# Patient Record
Sex: Male | Born: 1975 | Race: White | Hispanic: No | Marital: Married | Smoking: Current every day smoker
Health system: Southern US, Community
[De-identification: ages and names within clinical notes are randomized; demographics above are authoritative.]

## PROBLEM LIST (undated history)

## (undated) DIAGNOSIS — M545 Low back pain, unspecified: Secondary | ICD-10-CM

## (undated) DIAGNOSIS — F419 Anxiety disorder, unspecified: Secondary | ICD-10-CM

## (undated) DIAGNOSIS — I10 Essential (primary) hypertension: Secondary | ICD-10-CM

## (undated) DIAGNOSIS — E669 Obesity, unspecified: Secondary | ICD-10-CM

## (undated) HISTORY — DX: Low back pain: M54.5

## (undated) HISTORY — DX: Anxiety disorder, unspecified: F41.9

## (undated) HISTORY — DX: Obesity, unspecified: E66.9

## (undated) HISTORY — DX: Low back pain, unspecified: M54.50

---

## 1998-04-24 ENCOUNTER — Emergency Department (HOSPITAL_COMMUNITY): Admission: EM | Admit: 1998-04-24 | Discharge: 1998-04-24 | Payer: Self-pay | Admitting: Emergency Medicine

## 1998-04-25 ENCOUNTER — Emergency Department (HOSPITAL_COMMUNITY): Admission: EM | Admit: 1998-04-25 | Discharge: 1998-04-25 | Payer: Self-pay | Admitting: Internal Medicine

## 1999-01-20 ENCOUNTER — Emergency Department (HOSPITAL_COMMUNITY): Admission: EM | Admit: 1999-01-20 | Discharge: 1999-01-20 | Payer: Self-pay | Admitting: Emergency Medicine

## 1999-01-20 ENCOUNTER — Encounter: Payer: Self-pay | Admitting: Emergency Medicine

## 1999-07-10 ENCOUNTER — Encounter: Payer: Self-pay | Admitting: Emergency Medicine

## 1999-07-10 ENCOUNTER — Emergency Department (HOSPITAL_COMMUNITY): Admission: EM | Admit: 1999-07-10 | Discharge: 1999-07-10 | Payer: Self-pay | Admitting: Emergency Medicine

## 1999-10-22 ENCOUNTER — Emergency Department (HOSPITAL_COMMUNITY): Admission: EM | Admit: 1999-10-22 | Discharge: 1999-10-23 | Payer: Self-pay | Admitting: Emergency Medicine

## 1999-10-23 ENCOUNTER — Encounter: Payer: Self-pay | Admitting: Emergency Medicine

## 2000-11-05 ENCOUNTER — Emergency Department (HOSPITAL_COMMUNITY): Admission: EM | Admit: 2000-11-05 | Discharge: 2000-11-05 | Payer: Self-pay | Admitting: *Deleted

## 2004-03-11 ENCOUNTER — Emergency Department (HOSPITAL_COMMUNITY): Admission: EM | Admit: 2004-03-11 | Discharge: 2004-03-11 | Payer: Self-pay | Admitting: *Deleted

## 2004-05-26 ENCOUNTER — Emergency Department (HOSPITAL_COMMUNITY): Admission: EM | Admit: 2004-05-26 | Discharge: 2004-05-26 | Payer: Self-pay | Admitting: Emergency Medicine

## 2007-03-22 ENCOUNTER — Emergency Department (HOSPITAL_COMMUNITY): Admission: EM | Admit: 2007-03-22 | Discharge: 2007-03-22 | Payer: Self-pay | Admitting: Emergency Medicine

## 2008-07-30 ENCOUNTER — Emergency Department: Payer: Self-pay | Admitting: Unknown Physician Specialty

## 2008-10-07 ENCOUNTER — Emergency Department: Payer: Self-pay

## 2008-12-20 ENCOUNTER — Emergency Department (HOSPITAL_COMMUNITY): Admission: EM | Admit: 2008-12-20 | Discharge: 2008-12-20 | Payer: Self-pay | Admitting: Emergency Medicine

## 2010-01-26 ENCOUNTER — Emergency Department (HOSPITAL_COMMUNITY): Admission: EM | Admit: 2010-01-26 | Discharge: 2010-01-26 | Payer: Self-pay | Admitting: Emergency Medicine

## 2011-09-09 ENCOUNTER — Emergency Department (HOSPITAL_COMMUNITY)
Admission: EM | Admit: 2011-09-09 | Discharge: 2011-09-09 | Disposition: A | Discharge status: Home / Self Care | Payer: Self-pay | Attending: Emergency Medicine | Admitting: Emergency Medicine

## 2011-09-09 DIAGNOSIS — T391X1A Poisoning by 4-Aminophenol derivatives, accidental (unintentional), initial encounter: Secondary | ICD-10-CM | POA: Insufficient documentation

## 2011-09-09 LAB — SALICYLATE LEVEL: Salicylate Lvl: <2 mg/dL — ABNORMAL LOW (ref 2.8–20.0)

## 2011-09-09 LAB — HEPATIC FUNCTION PANEL
AST: 35 U/L (ref 0–37)
Albumin: 3.9 g/dL (ref 3.5–5.2)
Bilirubin, Direct: <0.1 mg/dL (ref 0.0–0.3)
Total Protein: 7.2 g/dL (ref 6.0–8.3)

## 2011-09-09 LAB — CBC
HCT: 47.3 % (ref 39.0–52.0)
Hemoglobin: 16.6 g/dL (ref 13.0–17.0)
RBC: 5.83 MIL/uL — ABNORMAL HIGH (ref 4.22–5.81)
WBC: 11.8 10*3/uL — ABNORMAL HIGH (ref 4.0–10.5)

## 2011-09-09 LAB — PROTIME-INR: INR: 0.94 (ref 0.00–1.49)

## 2011-09-09 LAB — RAPID URINE DRUG SCREEN, HOSP PERFORMED
Amphetamines: NOT DETECTED
Benzodiazepines: NOT DETECTED
Opiates: NOT DETECTED

## 2011-09-09 LAB — POCT I-STAT, CHEM 8
Chloride: 108 mEq/L (ref 96–112)
HCT: 52 % (ref 39.0–52.0)
Hemoglobin: 17.7 g/dL — ABNORMAL HIGH (ref 13.0–17.0)
Potassium: 3.9 mEq/L (ref 3.5–5.1)
Sodium: 140 mEq/L (ref 135–145)

## 2011-09-09 LAB — DIFFERENTIAL
Basophils Absolute: 0.1 10*3/uL (ref 0.0–0.1)
Lymphocytes Relative: 24 % (ref 12–46)
Neutro Abs: 7.8 10*3/uL — ABNORMAL HIGH (ref 1.7–7.7)

## 2011-09-09 LAB — ACETAMINOPHEN LEVEL: Acetaminophen (Tylenol), Serum: <15 ug/mL (ref 10–30)

## 2012-04-22 ENCOUNTER — Ambulatory Visit (HOSPITAL_COMMUNITY)
Admission: RE | Admit: 2012-04-22 | Discharge: 2012-04-22 | Disposition: A | Discharge status: Home / Self Care | Payer: PRIVATE HEALTH INSURANCE | Attending: Psychiatry | Admitting: Psychiatry

## 2012-04-22 ENCOUNTER — Telehealth (HOSPITAL_COMMUNITY): Payer: Self-pay | Admitting: Licensed Clinical Social Worker

## 2012-04-22 ENCOUNTER — Encounter (HOSPITAL_COMMUNITY): Payer: Self-pay | Admitting: Licensed Clinical Social Worker

## 2012-04-22 DIAGNOSIS — F1010 Alcohol abuse, uncomplicated: Secondary | ICD-10-CM | POA: Insufficient documentation

## 2012-04-22 DIAGNOSIS — F39 Unspecified mood [affective] disorder: Secondary | ICD-10-CM | POA: Insufficient documentation

## 2012-04-22 DIAGNOSIS — F411 Generalized anxiety disorder: Secondary | ICD-10-CM | POA: Insufficient documentation
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
## 2012-05-29 ENCOUNTER — Emergency Department (HOSPITAL_BASED_OUTPATIENT_CLINIC_OR_DEPARTMENT_OTHER)
Admission: EM | Admit: 2012-05-29 | Discharge: 2012-05-29 | Disposition: A | Discharge status: Home / Self Care | Payer: PRIVATE HEALTH INSURANCE | Attending: Emergency Medicine | Admitting: Emergency Medicine

## 2012-05-29 ENCOUNTER — Encounter (HOSPITAL_BASED_OUTPATIENT_CLINIC_OR_DEPARTMENT_OTHER): Payer: Self-pay | Admitting: *Deleted

## 2012-05-29 DIAGNOSIS — M549 Dorsalgia, unspecified: Secondary | ICD-10-CM

## 2012-05-29 DIAGNOSIS — Y92009 Unspecified place in unspecified non-institutional (private) residence as the place of occurrence of the external cause: Secondary | ICD-10-CM | POA: Insufficient documentation

## 2012-05-29 DIAGNOSIS — IMO0002 Reserved for concepts with insufficient information to code with codable children: Secondary | ICD-10-CM | POA: Insufficient documentation

## 2012-05-29 DIAGNOSIS — X500XXA Overexertion from strenuous movement or load, initial encounter: Secondary | ICD-10-CM | POA: Insufficient documentation

## 2012-05-29 DIAGNOSIS — Y9389 Activity, other specified: Secondary | ICD-10-CM | POA: Insufficient documentation

## 2012-05-29 DIAGNOSIS — Y998 Other external cause status: Secondary | ICD-10-CM | POA: Insufficient documentation

## 2012-05-29 DIAGNOSIS — I10 Essential (primary) hypertension: Secondary | ICD-10-CM | POA: Insufficient documentation

## 2012-05-29 DIAGNOSIS — T148XXA Other injury of unspecified body region, initial encounter: Secondary | ICD-10-CM

## 2012-05-29 HISTORY — DX: Essential (primary) hypertension: I10

## 2012-05-29 MED ORDER — CYCLOBENZAPRINE HCL 5 MG PO TABS: 5.0000 mg | ORAL_TABLET | Freq: Two times a day (BID) | ORAL | Status: AC | PRN

## 2012-05-29 MED ORDER — IBUPROFEN 600 MG PO TABS: 600.0000 mg | ORAL_TABLET | Freq: Four times a day (QID) | ORAL | Status: AC | PRN

## 2012-05-29 MED ORDER — LISINOPRIL 20 MG PO TABS: 10.0000 mg | ORAL_TABLET | Freq: Every day | ORAL | Status: DC

## 2012-05-29 NOTE — ED Provider Notes (Signed)
History   This chart was scribed for Forbes Cellar, MD by Shari Heritage. The patient was seen in room MH07/MH07. Patient's care was started at 1643.     CSN: [id number]  Arrival date & time 05/29/12  1643   First MD Initiated Contact with Patient 05/29/12 1726      Chief Complaint  Patient presents with  . Back Pain    (Consider location/radiation/quality/duration/timing/severity/associated sxs/prior treatment) The history is provided by the patient and the spouse. No language interpreter was used.   Jeffery Hendrix is a 36 y.o. Male  who presents to the Emergency Department complaining of persistent, moderate, bilateral lower back pain onset 6 weeks ago. Patient says that his pain is 5/10. Patient says that he suffered an injury to his back approximately 6 weeks ago while moving a chair at home. Patient denies prior lower back pain. Patient denies urinary symptoms including frequency, urgency, dysuria or hematuria. Patient also denies nausea, vomiting, diarrhea, fever, chills. Patient denies substance abuse or steroid use. Patient says that he decided to come in to ED because he needs medical clearance for work. Patient states that he lost his original medical notation from the Urgent Care center. 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 Patient with h/o anxiety and HTN. Patient takes Lisinopril to control his BP, but he needs a refill.     York Cerise, RN 05/29/2012 16:56     Pt states he injured his back about 6 wks ago and was seen at Urgent Care and treated. Got somewhat better, but reinjured and UC was closed. Not allowed to go back to work until cleared. Needs refill for BP med as well.    Past Medical History  Diagnosis Date  . Anxiety   . Hypertension     History reviewed. No pertinent past surgical history.  History reviewed. No pertinent family history.  History  Substance Use Topics  . Smoking status: Not on file  .  Smokeless tobacco: Not on file  . Alcohol Use: 0.0 oz/week    56-64 Cans of beer per week    Review of Systems A complete 10 system review of systems was obtained and all systems are negative except as noted in the HPI and PMH.   Allergies  Review of patient's allergies indicates no known allergies.  Home Medications   Current Outpatient Rx  Name Route Sig Dispense Refill  . CYCLOBENZAPRINE HCL 5 MG PO TABS Oral Take 1 tablet (5 mg total) by mouth 2 (two) times daily as needed for muscle spasms. 10 tablet 0  . IBUPROFEN 600 MG PO TABS Oral Take 1 tablet (600 mg total) by mouth every 6 (six) hours as needed for pain. 30 tablet 0  . LISINOPRIL 20 MG PO TABS Oral Take 0.5 tablets (10 mg total) by mouth daily. 10 tablet 0    BP 162/88  Pulse 92  Temp 98.6 F (37 C) (Oral)  Resp 18  Ht 5\' 7"  (1.702 m)  Wt 260 lb (117.935 kg)  BMI 40.72 kg/m2  SpO2 100%  Physical Exam  Constitutional: He is oriented to person, place, and time. He appears well-developed and well-nourished. No distress.  HENT:  Head: Atraumatic.  Eyes: Conjunctivae are normal.  Neck: Neck supple.  Cardiovascular: Normal rate and regular rhythm.   Pulmonary/Chest: Effort normal and breath sounds normal. He has no wheezes. He has no rales.  Abdominal: Soft. Bowel sounds are normal. He exhibits no distension.  There is no tenderness. There is no rebound, no guarding and no CVA tenderness.  Musculoskeletal: He exhibits no edema.       Lumbar back: He exhibits tenderness (Lower lumbar midline and right paraspinal tenderness to palpation.).       Lower lumbar midline and right paraspinal tenderness to palpation. No saddle anesthesia 5/5 strength in bilateral extremities. Straight leg raise is negative bilaterally. Good PT pulses.  Neurological: He is alert and oriented to person, place, and time.  Skin: Skin is warm and dry. He is not diaphoretic.  Psychiatric: He has a normal mood and affect.    ED Course    Procedures (including critical care time) DIAGNOSTIC STUDIES: Oxygen Saturation is 100% on room air, normal by my interpretation.    COORDINATION OF CARE: 5:35PM- Patient informed of current plan for treatment and evaluation and agrees with plan at this time.   Labs Reviewed - No data to display No results found.   1. Back pain   2. Musculoskeletal strain   3. Hypertension     MDM  Msk back pain. Do not suspect cauda equina. Here for work note which we will provide. Also requesting refill of his antihypertensives. No EMC precluding discharge at this time. Given Precautions for return. PMD f/u.  I personally performed the services described in this documentation, which was scribed in my presence. The recorded information has been reviewed and considered.     Forbes Cellar, MD 05/30/12 [phone]

## 2012-05-29 NOTE — ED Notes (Signed)
Pt presents to ED today with continued back pain.  Pt was seen and treated at UC approx 6 weeks ago for initial injury.  Pt has since re-injured back and was trying to go to UC again but they were closed today.  Pt cannot return to work "until re-evaluated by MD"

## 2012-05-29 NOTE — ED Notes (Signed)
Pt states he injured his back about 6 wks ago and was seen at Urgent Care and treated. Got somewhat better, but reinjured and UC was closed. Not allowed to go back to work until cleared. Needs refill for BP med as well.

## 2012-05-29 NOTE — ED Notes (Signed)
rx x 3 for ibuprofen, flexeril and lisinopril

## 2012-06-01 ENCOUNTER — Ambulatory Visit (INDEPENDENT_AMBULATORY_CARE_PROVIDER_SITE_OTHER): Payer: PRIVATE HEALTH INSURANCE | Admitting: Physician Assistant

## 2012-06-01 DIAGNOSIS — F1220 Cannabis dependence, uncomplicated: Secondary | ICD-10-CM

## 2012-06-01 DIAGNOSIS — F39 Unspecified mood [affective] disorder: Secondary | ICD-10-CM

## 2012-06-01 DIAGNOSIS — F1020 Alcohol dependence, uncomplicated: Secondary | ICD-10-CM

## 2012-06-01 MED ORDER — DIVALPROEX SODIUM ER 500 MG PO TB24: 500.0000 mg | ORAL_TABLET | Freq: Every day | ORAL | Status: DC

## 2012-06-01 MED ORDER — LISINOPRIL 20 MG PO TABS: 20.0000 mg | ORAL_TABLET | Freq: Every day | ORAL | Status: DC

## 2012-06-06 ENCOUNTER — Encounter (HOSPITAL_COMMUNITY): Payer: Self-pay | Admitting: Physician Assistant

## 2012-06-06 DIAGNOSIS — F39 Unspecified mood [affective] disorder: Secondary | ICD-10-CM | POA: Insufficient documentation

## 2012-06-06 DIAGNOSIS — F1020 Alcohol dependence, uncomplicated: Secondary | ICD-10-CM | POA: Insufficient documentation

## 2012-06-06 DIAGNOSIS — F1220 Cannabis dependence, uncomplicated: Secondary | ICD-10-CM | POA: Insufficient documentation
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
## 2012-07-25 ENCOUNTER — Other Ambulatory Visit (HOSPITAL_COMMUNITY): Payer: Self-pay | Admitting: Physician Assistant

## 2013-05-08 ENCOUNTER — Emergency Department (HOSPITAL_COMMUNITY)
Admission: EM | Admit: 2013-05-08 | Discharge: 2013-05-08 | Disposition: A | Discharge status: Home / Self Care | Payer: Self-pay | Attending: Emergency Medicine | Admitting: Emergency Medicine

## 2013-05-08 ENCOUNTER — Encounter (HOSPITAL_COMMUNITY): Payer: Self-pay | Admitting: *Deleted

## 2013-05-08 DIAGNOSIS — S61409A Unspecified open wound of unspecified hand, initial encounter: Secondary | ICD-10-CM | POA: Insufficient documentation

## 2013-05-08 DIAGNOSIS — Z8739 Personal history of other diseases of the musculoskeletal system and connective tissue: Secondary | ICD-10-CM | POA: Insufficient documentation

## 2013-05-08 DIAGNOSIS — W540XXA Bitten by dog, initial encounter: Secondary | ICD-10-CM | POA: Insufficient documentation

## 2013-05-08 DIAGNOSIS — Y9389 Activity, other specified: Secondary | ICD-10-CM | POA: Insufficient documentation

## 2013-05-08 DIAGNOSIS — S61452A Open bite of left hand, initial encounter: Secondary | ICD-10-CM

## 2013-05-08 DIAGNOSIS — I10 Essential (primary) hypertension: Secondary | ICD-10-CM | POA: Insufficient documentation

## 2013-05-08 DIAGNOSIS — Y92009 Unspecified place in unspecified non-institutional (private) residence as the place of occurrence of the external cause: Secondary | ICD-10-CM | POA: Insufficient documentation

## 2013-05-08 DIAGNOSIS — F17200 Nicotine dependence, unspecified, uncomplicated: Secondary | ICD-10-CM | POA: Insufficient documentation

## 2013-05-08 DIAGNOSIS — F411 Generalized anxiety disorder: Secondary | ICD-10-CM | POA: Insufficient documentation

## 2013-05-08 DIAGNOSIS — E669 Obesity, unspecified: Secondary | ICD-10-CM | POA: Insufficient documentation

## 2013-05-08 MED ORDER — AMOXICILLIN-POT CLAVULANATE 875-125 MG PO TABS
1.0000 | ORAL_TABLET | Freq: Once | ORAL | Status: AC
  Administered 2013-05-08: 1 via ORAL
  Filled 2013-05-08: qty 1

## 2013-05-08 MED ORDER — TETANUS-DIPHTH-ACELL PERTUSSIS 5-2.5-18.5 LF-MCG/0.5 IM SUSP
0.5000 mL | Freq: Once | INTRAMUSCULAR | Status: AC
  Administered 2013-05-08: 0.5 mL via INTRAMUSCULAR
  Filled 2013-05-08: qty 0.5

## 2013-05-08 MED ORDER — AMOXICILLIN-POT CLAVULANATE 875-125 MG PO TABS
1.0000 | ORAL_TABLET | Freq: Two times a day (BID) | ORAL | Status: DC
Start: 2013-05-08 — End: 2015-09-03

## 2013-05-08 MED ORDER — HYDROCODONE-ACETAMINOPHEN 5-325 MG PO TABS: 1.0000 | ORAL_TABLET | ORAL | Status: DC | PRN

## 2013-05-08 MED ORDER — OXYCODONE-ACETAMINOPHEN 5-325 MG PO TABS
1.0000 | ORAL_TABLET | Freq: Once | ORAL | Status: AC
  Administered 2013-05-08: 1 via ORAL
  Filled 2013-05-08: qty 1

## 2013-05-08 NOTE — ED Provider Notes (Signed)
History    CSN: [id number] Arrival date & time 05/08/13  1946  None    Chief Complaint  Patient presents with  . Animal Bite   (Consider location/radiation/quality/duration/timing/severity/associated sxs/prior Treatment) Patient is a 37 y.o. male presenting with animal bite. The history is provided by the patient.  Animal Bite Contact animal:  Dog Time since incident:  2 hours Pain details:    Quality:  Aching and sore   Severity:  Moderate   Timing:  Constant   Progression:  Worsening Incident location:  Home Provoked: dog caught leg in cage, patient trying to free him.   Notifications:  None Animal's rabies vaccination status:  Unknown Animal in possession: yes   Relieved by:  Nothing Worsened by:  Activity Ineffective treatments:  None tried Associated symptoms: no fever    JONANTHAN BOLENDER is a 37 y.o. male who presents to the ED with a dog bite to the left hand. His dog got his foot caught in his cage and the patient was trying to help him and he bit the patient. Patient complains of pain and swelling to the thenar area of the left hand. The dog belongs to the patient but he is unsure if the dog is up to date on shots. Dog's tooth did not break or come out.   Past Medical History  Diagnosis Date  . Anxiety   . Hypertension   . Obesity   . LBP (low back pain)    History reviewed. No pertinent past surgical history. Family History  Problem Relation Age of Onset  . Alcohol abuse Father   . Alcohol abuse Mother   . Bipolar disorder Mother   . Depression Sister   . Alcohol abuse Brother    History  Substance Use Topics  . Smoking status: Current Every Day Smoker -- 0.80 packs/day for 22 years    Types: Cigarettes  . Smokeless tobacco: Not on file  . Alcohol Use: 0.0 oz/week    56-64 Cans of beer per week    Review of Systems  Constitutional: Negative for fever and chills.  HENT: Negative for neck pain.   Gastrointestinal: Negative for nausea and vomiting.   Skin: Positive for wound.  Neurological: Negative for dizziness and headaches.  Psychiatric/Behavioral: The patient is not nervous/anxious.     Allergies  Review of patient's allergies indicates no known allergies.  Home Medications  No current outpatient prescriptions on file. BP 156/95  Pulse 86  Temp(Src) 98.9 F (37.2 C) (Oral)  Resp 20  Ht 5\' 7"  (1.702 m)  Wt 230 lb (104.327 kg)  BMI 36.01 kg/m2  SpO2 98% Physical Exam  Nursing note and vitals reviewed. Constitutional: He is oriented to person, place, and time. He appears well-developed and well-nourished.  HENT:  Head: Normocephalic.  Eyes: EOM are normal.  Neck: Neck supple.  Cardiovascular: Normal rate.   Pulmonary/Chest: Effort normal.  Musculoskeletal:       Left hand: He exhibits decreased range of motion, tenderness and swelling. He exhibits normal capillary refill and no deformity. Lacerations: puncture wound. Normal sensation noted. Normal strength noted.       Hands: Neurological: He is alert and oriented to person, place, and time. No cranial nerve deficit.  Skin: Skin is warm and dry.  Psychiatric: He has a normal mood and affect. His behavior is normal. Judgment and thought content normal.    ED Course: I discussed this case with Dr. Blinda Leatherwood and he agrees with plan of care.  Procedures  MDM  37 y.o. male with dog bite to the left hand thenar area with puncture site, swelling and tenderness. Patient stable for discharge home without immediate complications. Stressed importance of antibiotics and follow up with the patient. Stressed how hand wounds can become serious quickly. Patient will observe his dog for 10 days and report if anything changes. Discussed with the patient plan of care and all questioned fully answered. He will return tomorrow for follow up.    Medication List    TAKE these medications       amoxicillin-clavulanate 875-125 MG per tablet  Commonly known as:  AUGMENTIN  Take 1 tablet  by mouth 2 (two) times daily.     HYDROcodone-acetaminophen 5-325 MG per tablet  Commonly known as:  NORCO/VICODIN  Take 1 tablet by mouth every 4 (four) hours as needed.         [place], [place] 05/08/13 2159

## 2013-05-08 NOTE — ED Notes (Signed)
Pt presents with puncture wound to left thumb. Pt states he was bit by his dog earlier today. Bleeding controlled pta. Pt has limited movement to injured thumb.

## 2013-05-08 NOTE — ED Notes (Signed)
Dog bite to lt hand.Belongs to pt. Says dog is healthy but does not know about rabies immunization

## 2013-05-09 ENCOUNTER — Emergency Department (HOSPITAL_COMMUNITY)
Admission: EM | Admit: 2013-05-09 | Discharge: 2013-05-09 | Disposition: A | Discharge status: Home / Self Care | Payer: Self-pay | Attending: Emergency Medicine | Admitting: Emergency Medicine

## 2013-05-09 ENCOUNTER — Encounter (HOSPITAL_COMMUNITY): Payer: Self-pay

## 2013-05-09 DIAGNOSIS — S61409A Unspecified open wound of unspecified hand, initial encounter: Secondary | ICD-10-CM | POA: Insufficient documentation

## 2013-05-09 DIAGNOSIS — Z8659 Personal history of other mental and behavioral disorders: Secondary | ICD-10-CM | POA: Insufficient documentation

## 2013-05-09 DIAGNOSIS — M545 Low back pain, unspecified: Secondary | ICD-10-CM | POA: Insufficient documentation

## 2013-05-09 DIAGNOSIS — Y939 Activity, unspecified: Secondary | ICD-10-CM | POA: Insufficient documentation

## 2013-05-09 DIAGNOSIS — Y929 Unspecified place or not applicable: Secondary | ICD-10-CM | POA: Insufficient documentation

## 2013-05-09 DIAGNOSIS — F17200 Nicotine dependence, unspecified, uncomplicated: Secondary | ICD-10-CM | POA: Insufficient documentation

## 2013-05-09 DIAGNOSIS — W540XXA Bitten by dog, initial encounter: Secondary | ICD-10-CM | POA: Insufficient documentation

## 2013-05-09 DIAGNOSIS — E669 Obesity, unspecified: Secondary | ICD-10-CM | POA: Insufficient documentation

## 2013-05-09 DIAGNOSIS — S61452D Open bite of left hand, subsequent encounter: Secondary | ICD-10-CM

## 2013-05-09 DIAGNOSIS — I10 Essential (primary) hypertension: Secondary | ICD-10-CM | POA: Insufficient documentation

## 2013-05-09 MED ORDER — KETOROLAC TROMETHAMINE 10 MG PO TABS
10.0000 mg | ORAL_TABLET | Freq: Once | ORAL | Status: AC
  Administered 2013-05-09: 10 mg via ORAL
  Filled 2013-05-09: qty 1

## 2013-05-09 MED ORDER — ONDANSETRON HCL 4 MG PO TABS
4.0000 mg | ORAL_TABLET | Freq: Once | ORAL | Status: AC
  Administered 2013-05-09: 4 mg via ORAL
  Filled 2013-05-09: qty 1

## 2013-05-09 MED ORDER — DICLOFENAC SODIUM 75 MG PO TBEC: 75.0000 mg | DELAYED_RELEASE_TABLET | Freq: Two times a day (BID) | ORAL | Status: DC

## 2013-05-09 MED ORDER — HYDROCODONE-ACETAMINOPHEN 5-325 MG PO TABS
2.0000 | ORAL_TABLET | Freq: Once | ORAL | Status: AC
  Administered 2013-05-09: 2 via ORAL
  Filled 2013-05-09: qty 2

## 2013-05-09 NOTE — ED Notes (Signed)
Pt reports was bit by dog to left hand and was evaluated here last night.  Pt says was told to return here today for recheck.

## 2013-05-09 NOTE — ED Provider Notes (Signed)
History  This chart was scribed for [facility]. Danae Orleans, working with Shelda Jakes, MD by Ardelia Mems, ED Scribe. This patient was seen in room APFT20/APFT20 and the patient's care was started at 2:24 PM.  CSN: [id number]  Arrival date & time 05/09/13  1259    Chief Complaint  Patient presents with  . Animal Bite    The history is provided by the patient. No language interpreter was used.   HPI Comments: Jeffery Hendrix is a 37 y.o. male who presents to the Emergency Department requesting evaluation of a dog bite sustained to the thenar eminence of his left hand yesterday. There is associated constant, moderate pain. Pt was evaluated last night and was told to  Come to the ED today. 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 Pt denies fever, chills, SOB, chest pain, nausea, vomiting, diarrhea or any other symptoms.  PCP- None  Past Medical History  Diagnosis Date  . Anxiety   . Hypertension   . Obesity   . LBP (low back pain)    History reviewed. No pertinent past surgical history. Family History  Problem Relation Age of Onset  . Alcohol abuse Father   . Alcohol abuse Mother   . Bipolar disorder Mother   . Depression Sister   . Alcohol abuse Brother    History  Substance Use Topics  . Smoking status: Current Every Day Smoker -- 0.80 packs/day for 22 years    Types: Cigarettes  . Smokeless tobacco: Not on file  . Alcohol Use: 0.0 oz/week     Comment: occ    Review of Systems  Constitutional: Negative for fever and chills.  HENT: Negative for congestion, sore throat, rhinorrhea and neck pain.   Eyes: Negative for visual disturbance.  Respiratory: Negative for cough and shortness of breath.   Cardiovascular: Negative for chest pain.  Gastrointestinal: Negative for nausea, vomiting, abdominal pain and diarrhea.  Genitourinary: Negative for dysuria and hematuria.  Skin: Negative for rash.  Neurological: Negative for  headaches.  Hematological: Does not bruise/bleed easily.  Psychiatric/Behavioral: Negative for confusion.   A complete 10 system review of systems was obtained and all systems are negative except as noted in the HPI and PMH.   Allergies  Review of patient's allergies indicates no known allergies.  Home Medications   Current Outpatient Rx  Name  Route  Sig  Dispense  Refill  . amoxicillin-clavulanate (AUGMENTIN) 875-125 MG per tablet   Oral   Take 1 tablet by mouth 2 (two) times daily.   14 tablet   0   . HYDROcodone-acetaminophen (NORCO/VICODIN) 5-325 MG per tablet   Oral   Take 1 tablet by mouth every 4 (four) hours as needed.   15 tablet   0    Triage Vitals: BP 136/88  Pulse 85  Temp(Src) 97.9 F (36.6 C) (Oral)  Resp 20  Ht 5\' 6"  (1.676 m)  Wt 230 lb (104.327 kg)  BMI 37.14 kg/m2  SpO2 99%  Physical Exam  Constitutional: He is oriented to person, place, and time. He appears well-developed and well-nourished.  HENT:  Head: Normocephalic and atraumatic.  Eyes: Conjunctivae and EOM are normal. Pupils are equal, round, and reactive to light.  Neck: Normal range of motion. Neck supple. No tracheal deviation present.  Cardiovascular: Normal rate, regular rhythm and normal heart sounds.   No murmur heard. Pulmonary/Chest: Effort normal and breath sounds normal. No  respiratory distress.  Abdominal: Soft. Bowel sounds are normal. There is no tenderness.  Musculoskeletal: Normal range of motion. He exhibits no tenderness.  Full ROM of all digits of the left hand. Radial pulse is 2+. No deformity of the wrist. No forearm hematoma or pain. No lymphadenopathy of the biceps/triceps area.  Neurological: He is alert and oriented to person, place, and time.  Skin: Skin is warm and dry. No rash noted.  Capillary refill is less than 3 seconds to the fingers of the left hand. Puncture wound of the thenar eminence on the left hand. There is no red streaking or drainage from the  puncture wound.  Psychiatric: He has a normal mood and affect.    ED Course  Procedures (including critical care time)  DIAGNOSTIC STUDIES: Oxygen Saturation is 99% on RA, normal by my interpretation.    COORDINATION OF CARE: 2:26 PM- Pt informed that the wound does not appear infected, and advised that the hand will be wrapped and pt will need some time off of work.  Medications  HYDROcodone-acetaminophen (NORCO/VICODIN) 5-325 MG per tablet 2 tablet (not administered)  ketorolac (TORADOL) tablet 10 mg (not administered)  ondansetron (ZOFRAN) tablet 4 mg (not administered)     Labs Reviewed - No data to display  No results found.  1. Animal bite of hand, left, subsequent encounter     MDM  I have reviewed nursing notes, vital signs, and all appropriate lab and imaging results for this patient. The dog bite to the left hand is progressing nicely. There is no significant drainage present. No red streaking noted. Capillary refill is less than 3 seconds.  Plan at this time is for the patient to continue his antibiotic and, as well as his Norco. Will add diclofenac to help with the soreness. Patient is to see his primary physician, or return to the emergency department if any changes, problems, or concerns.       Kathie Dike, PA-C 05/09/13 1649

## 2013-05-10 NOTE — ED Provider Notes (Signed)
Medical screening examination/treatment/procedure(s) were performed by non-physician practitioner and as supervising physician I was immediately available for consultation/collaboration.     Gilda Crease, MD 05/10/13 1505

## 2013-05-12 NOTE — ED Provider Notes (Signed)
Medical screening examination/treatment/procedure(s) were performed by non-physician practitioner and as supervising physician I was immediately available for consultation/collaboration.   Shelda Jakes, MD 05/12/13 [phone]

## 2015-08-28 ENCOUNTER — Telehealth: Payer: Self-pay

## 2015-08-28 NOTE — Telephone Encounter (Signed)
Pt states he lost all his paperwork from yesterday including his RX,requesting statement Stating he was here yesterday because his employer wont let him come back to work Until he has statement in hand????   Best phone for pt is [phone]

## 2015-08-29 NOTE — Telephone Encounter (Signed)
He was not seen here he has never been seen here, I called him left message he has called wrong office he needs to call correct office

## 2015-09-03 ENCOUNTER — Encounter (HOSPITAL_BASED_OUTPATIENT_CLINIC_OR_DEPARTMENT_OTHER): Payer: Self-pay | Admitting: Emergency Medicine

## 2015-09-03 ENCOUNTER — Emergency Department (HOSPITAL_BASED_OUTPATIENT_CLINIC_OR_DEPARTMENT_OTHER)
Admission: EM | Admit: 2015-09-03 | Discharge: 2015-09-03 | Disposition: A | Discharge status: Home / Self Care | Payer: PRIVATE HEALTH INSURANCE | Attending: Emergency Medicine | Admitting: Emergency Medicine

## 2015-09-03 DIAGNOSIS — Z8659 Personal history of other mental and behavioral disorders: Secondary | ICD-10-CM | POA: Insufficient documentation

## 2015-09-03 DIAGNOSIS — Z720 Tobacco use: Secondary | ICD-10-CM | POA: Insufficient documentation

## 2015-09-03 DIAGNOSIS — Z792 Long term (current) use of antibiotics: Secondary | ICD-10-CM | POA: Insufficient documentation

## 2015-09-03 DIAGNOSIS — R42 Dizziness and giddiness: Secondary | ICD-10-CM | POA: Insufficient documentation

## 2015-09-03 DIAGNOSIS — I10 Essential (primary) hypertension: Secondary | ICD-10-CM | POA: Insufficient documentation

## 2015-09-03 DIAGNOSIS — Z791 Long term (current) use of non-steroidal anti-inflammatories (NSAID): Secondary | ICD-10-CM | POA: Insufficient documentation

## 2015-09-03 DIAGNOSIS — E669 Obesity, unspecified: Secondary | ICD-10-CM | POA: Insufficient documentation

## 2015-09-03 MED ORDER — HYDROCHLOROTHIAZIDE 25 MG PO TABS: 25.0000 mg | ORAL_TABLET | Freq: Every day | ORAL | Status: DC

## 2015-09-03 NOTE — ED Notes (Signed)
Patient states that he thinks that his blood pressure is up again because he is having weakness and dizziness. States that he is really tired

## 2015-09-03 NOTE — ED Provider Notes (Signed)
CSN: [id number]     Arrival date & time 09/03/15  0007 History   First MD Initiated Contact with Patient 09/03/15 0125     Chief Complaint  Patient presents with  . Weakness     (Consider location/radiation/quality/duration/timing/severity/associated sxs/prior Treatment) HPI  This is a 39 year old male who has a long-standing (over a year) history of episodic weakness and dizziness. By dizziness he means that he feels off balance and that his surroundings or spinning. Symptoms are worse with movement of his head. Symptoms are mild to moderate. He has found in the past that when these happen they will abate after lying still for several hours. He has been seen at other hospitals for this on at least 2 occasions and was told it was due to hypertension. He states he was given a pill in the ED on 1 occasion but no prescriptions were written. He has never been told he had vertigo. In 2013 he was seen here for back pain and was diagnosed with hypertension and prescribed lisinopril. He was lost to follow-up.  Past Medical History  Diagnosis Date  . Anxiety   . Hypertension   . Obesity   . LBP (low back pain)    History reviewed. No pertinent past surgical history. Family History  Problem Relation Age of Onset  . Alcohol abuse Father   . Alcohol abuse Mother   . Bipolar disorder Mother   . Depression Sister   . Alcohol abuse Brother    Social History  Substance Use Topics  . Smoking status: Current Every Day Smoker -- 0.80 packs/day for 22 years    Types: Cigarettes  . Smokeless tobacco: None  . Alcohol Use: 0.0 oz/week     Comment: occ    Review of Systems  All other systems reviewed and are negative.   Allergies  Review of patient's allergies indicates no known allergies.  Home Medications   Prior to Admission medications   Medication Sig Start Date End Date Taking? Authorizing Provider  amoxicillin-clavulanate (AUGMENTIN) 875-125 MG per tablet Take 1 tablet by mouth 2  (two) times daily. 05/08/13   Hope Orlene Och, NP  diclofenac (VOLTAREN) 75 MG EC tablet Take 1 tablet (75 mg total) by mouth 2 (two) times daily. 05/09/13   Ivery Quale, PA-C  HYDROcodone-acetaminophen (NORCO/VICODIN) 5-325 MG per tablet Take 1 tablet by mouth every 4 (four) hours as needed. 05/08/13   Hope Orlene Och, NP   BP 145/90 mmHg  Pulse 88  Temp(Src) 97.6 F (36.4 C) (Oral)  Resp 18  Ht  (1.676 m)  Wt 180 lb (81.647 kg)  BMI 29.07 kg/m2  SpO2 99%   Physical Exam  General: Well-developed, well-nourished male in no acute distress; appearance consistent with age of record HENT: normocephalic; atraumatic; TMs obscured by cerumen Eyes: pupils equal, round and reactive to light; extraocular muscles intact; no nystagmus Neck: supple Heart: regular rate and rhythm Lungs: clear to auscultation bilaterally Abdomen: soft; nondistended; nontender; bowel sounds present Extremities: No deformity; full range of motion; pulses normal Neurologic: Awake, alert and oriented; motor function intact in all extremities and symmetric; no facial droop Skin: Warm and dry Psychiatric: Normal mood and affect    ED Course  Procedures (including critical care time)   MDM  The patient does not wish to have any laboratory studies done at this time as he needs to get home to be with his children but is requesting referral to a primary care physician. He was advised that  his symptoms are suggestive of vertigo and he was advised to try over-the-counter meclizine if these episodes occur again. We will start him on hydrochlorothiazide for his blood pressure which is not severe.     Paula LibraJohn Grasiela Jonsson, MD 09/03/15 [phone]

## 2016-07-09 ENCOUNTER — Emergency Department
Admission: EM | Admit: 2016-07-09 | Discharge: 2016-07-09 | Disposition: A | Discharge status: Home / Self Care | Payer: Self-pay | Attending: Emergency Medicine | Admitting: Emergency Medicine

## 2016-07-09 ENCOUNTER — Emergency Department: Payer: Self-pay

## 2016-07-09 DIAGNOSIS — I10 Essential (primary) hypertension: Secondary | ICD-10-CM | POA: Insufficient documentation

## 2016-07-09 DIAGNOSIS — N200 Calculus of kidney: Secondary | ICD-10-CM | POA: Insufficient documentation

## 2016-07-09 DIAGNOSIS — Z79899 Other long term (current) drug therapy: Secondary | ICD-10-CM | POA: Insufficient documentation

## 2016-07-09 DIAGNOSIS — F17210 Nicotine dependence, cigarettes, uncomplicated: Secondary | ICD-10-CM | POA: Insufficient documentation

## 2016-07-09 LAB — URINALYSIS COMPLETE WITH MICROSCOPIC (ARMC ONLY)
Bacteria, UA: NONE SEEN
Bilirubin Urine: NEGATIVE
Glucose, UA: NEGATIVE mg/dL
KETONES UR: NEGATIVE mg/dL
LEUKOCYTES UA: NEGATIVE
NITRITE: NEGATIVE
PH: 5 (ref 5.0–8.0)
PROTEIN: NEGATIVE mg/dL
SPECIFIC GRAVITY, URINE: 1.024 (ref 1.005–1.030)

## 2016-07-09 LAB — COMPREHENSIVE METABOLIC PANEL
ALT: 31 U/L (ref 17–63)
ANION GAP: 10 (ref 5–15)
AST: 25 U/L (ref 15–41)
Albumin: 4.1 g/dL (ref 3.5–5.0)
Alkaline Phosphatase: 94 U/L (ref 38–126)
BILIRUBIN TOTAL: 0.6 mg/dL (ref 0.3–1.2)
BUN: 13 mg/dL (ref 6–20)
CO2: 21 mmol/L — ABNORMAL LOW (ref 22–32)
Calcium: 9.2 mg/dL (ref 8.9–10.3)
Chloride: 109 mmol/L (ref 101–111)
Creatinine, Ser: 0.93 mg/dL (ref 0.61–1.24)
GFR calc Af Amer: >60 mL/min (ref >60)
Glucose, Bld: 106 mg/dL — ABNORMAL HIGH (ref 65–99)
POTASSIUM: 3.8 mmol/L (ref 3.5–5.1)
Sodium: 140 mmol/L (ref 135–145)
TOTAL PROTEIN: 7.4 g/dL (ref 6.5–8.1)

## 2016-07-09 LAB — CBC WITH DIFFERENTIAL/PLATELET
BASOS PCT: 1 %
Basophils Absolute: 0.2 10*3/uL — ABNORMAL HIGH (ref 0–0.1)
EOS PCT: 2 %
Eosinophils Absolute: 0.3 10*3/uL (ref 0–0.7)
HEMATOCRIT: 49.6 % (ref 40.0–52.0)
Hemoglobin: 16.7 g/dL (ref 13.0–18.0)
Lymphocytes Relative: 38 %
Lymphs Abs: 7.2 10*3/uL — ABNORMAL HIGH (ref 1.0–3.6)
MCH: 27.9 pg (ref 26.0–34.0)
MCHC: 33.7 g/dL (ref 32.0–36.0)
MCV: 82.8 fL (ref 80.0–100.0)
MONO ABS: 1.4 10*3/uL — AB (ref 0.2–1.0)
MONOS PCT: 7 %
NEUTROS ABS: 10 10*3/uL — AB (ref 1.4–6.5)
Neutrophils Relative %: 52 %
Platelets: 285 10*3/uL (ref 150–440)
RBC: 5.99 MIL/uL — ABNORMAL HIGH (ref 4.40–5.90)
RDW: 14.3 % (ref 11.5–14.5)
WBC: 19.1 10*3/uL — ABNORMAL HIGH (ref 3.8–10.6)

## 2016-07-09 LAB — LIPASE, BLOOD: LIPASE: 24 U/L (ref 11–51)

## 2016-07-09 MED ORDER — KETOROLAC TROMETHAMINE 30 MG/ML IJ SOLN
30.0000 mg | Freq: Once | INTRAMUSCULAR | Status: AC
  Administered 2016-07-09: 30 mg via INTRAVENOUS
  Filled 2016-07-09: qty 1

## 2016-07-09 MED ORDER — HYDROMORPHONE HCL 1 MG/ML IJ SOLN
1.0000 mg | INTRAMUSCULAR | Status: DC | PRN
  Administered 2016-07-09: 1 mg via INTRAVENOUS
  Filled 2016-07-09: qty 1

## 2016-07-09 MED ORDER — PROMETHAZINE HCL 12.5 MG PO TABS: 12.5000 mg | ORAL_TABLET | Freq: Four times a day (QID) | ORAL | 0 refills | Status: DC | PRN

## 2016-07-09 MED ORDER — PROMETHAZINE HCL 25 MG/ML IJ SOLN
12.5000 mg | Freq: Once | INTRAMUSCULAR | Status: AC
Start: 2016-07-09 — End: 2016-07-09
  Administered 2016-07-09: 12.5 mg via INTRAVENOUS
  Filled 2016-07-09: qty 1

## 2016-07-09 MED ORDER — HYDROMORPHONE HCL 1 MG/ML IJ SOLN
INTRAMUSCULAR | Status: AC
  Administered 2016-07-09: 1 mg via INTRAVENOUS
  Filled 2016-07-09: qty 1

## 2016-07-09 MED ORDER — NAPROXEN 500 MG PO TABS: 500.0000 mg | ORAL_TABLET | Freq: Two times a day (BID) | ORAL | 0 refills | Status: DC

## 2016-07-09 MED ORDER — TAMSULOSIN HCL 0.4 MG PO CAPS
0.4000 mg | ORAL_CAPSULE | Freq: Once | ORAL | Status: AC
  Administered 2016-07-09: 0.4 mg via ORAL
  Filled 2016-07-09: qty 1

## 2016-07-09 MED ORDER — HYDROCODONE-ACETAMINOPHEN 5-325 MG PO TABS: 1.0000 | ORAL_TABLET | ORAL | 0 refills | Status: DC | PRN

## 2016-07-09 MED ORDER — SODIUM CHLORIDE 0.9 % IV BOLUS (SEPSIS)
1000.0000 mL | Freq: Once | INTRAVENOUS | Status: AC
  Administered 2016-07-09: 1000 mL via INTRAVENOUS

## 2016-07-09 MED ORDER — IOPAMIDOL (ISOVUE-300) INJECTION 61%
100.0000 mL | Freq: Once | INTRAVENOUS | Status: AC | PRN
  Administered 2016-07-09: 100 mL via INTRAVENOUS

## 2016-07-09 MED ORDER — DIATRIZOATE MEGLUMINE & SODIUM 66-10 % PO SOLN
15.0000 mL | Freq: Once | ORAL | Status: AC
Start: 2016-07-09 — End: 2016-07-09
  Administered 2016-07-09: 15 mL via ORAL

## 2016-07-09 MED ORDER — HYDROMORPHONE HCL 1 MG/ML IJ SOLN
1.0000 mg | INTRAMUSCULAR | Status: AC
  Administered 2016-07-09: 1 mg via INTRAVENOUS

## 2016-07-09 MED ORDER — TAMSULOSIN HCL 0.4 MG PO CAPS: 0.4000 mg | ORAL_CAPSULE | Freq: Every day | ORAL | 0 refills | Status: AC

## 2016-07-09 NOTE — Discharge Instructions (Addendum)

## 2016-07-09 NOTE — ED Triage Notes (Signed)
Pt to ED via [facility] c/o epigastric pain. Per EMS pt has epigastric pain that started an hour ago; pt has hx of kidney stones and reports blood in urine a dew days ago. Pt alert and oriented in no acute distress at this time.

## 2016-07-09 NOTE — ED Provider Notes (Signed)
-----------------------------------------   9:25 PM on 07/09/2016 -----------------------------------------   Blood pressure (!) 191/118, pulse 74, temperature 98 F (36.7 C), temperature source Oral, resp. rate 20, height 5\' 6"  (1.676 m), weight 240 lb (108.9 kg), SpO2 98 %.  Assuming care from Dr. Roxan Hockeyobinson of [facility] Jeffery Hendrix is a 40 y.o. male with a chief complaint of Abdominal Pain .    I was asked to follow up the results of the CT scan to rule out appendicitis versus kidney stone.   _________________________ 9:51 PM on 07/09/2016 -----------------------------------------  CT showing distal R ureteral stone. No evidence of appendicitis. No evidence of UTI on UA. Patient reports that pain is returning and is currently 7/10. Will give another round of meds and discharge home with instructions and prescriptions given by Dr. Roxan Hockeyobinson.    Nita Sicklearolina Jordie Skalsky, MD 07/09/16 2156

## 2016-07-09 NOTE — ED Provider Notes (Signed)
[facility] Emergency Department Provider Note    First MD Initiated Contact with Patient 07/09/16 [phone]     (approximate)  I have reviewed the triage vital signs and the nursing notes.   HISTORY  Chief Complaint Abdominal Pain    HPI Jeffery Hendrix is a 40 y.o. male presents with 4 hours of severe 10 out of 10 right lower quadrant pain. Patient states she has a history of kidney stones but this feels different. They did have some hematuria 4 days ago but none today. Patient states he was eating dinner with his children and roughly 45 minutes after eating had severe onset of his right lower quadrant pain   Past Medical History:  Diagnosis Date  . Anxiety   . Hypertension   . LBP (low back pain)   . Obesity     Patient Active Problem List   Diagnosis Date Noted  . Unspecified episodic mood disorder 06/06/2012  . Other and unspecified alcohol dependence, unspecified drinking behavior 06/06/2012  . Cannabis dependence, unspecified 06/06/2012    History reviewed. No pertinent surgical history.  Prior to Admission medications   Medication Sig Start Date End Date Taking? Authorizing Provider  hydrochlorothiazide (HYDRODIURIL) 25 MG tablet Take 1 tablet (25 mg total) by mouth daily. 09/03/15   Paula Libra, MD    Allergies Review of patient's allergies indicates no known allergies.  Family History  Problem Relation Age of Onset  . Alcohol abuse Father   . Alcohol abuse Mother   . Bipolar disorder Mother   . Depression Sister   . Alcohol abuse Brother     Social History Social History  Substance Use Topics  . Smoking status: Current Every Day Smoker    Packs/day: 0.80    Years: 22.00    Types: Cigarettes  . Smokeless tobacco: Never Used  . Alcohol use 0.0 oz/week     Comment: occ    Review of Systems Patient denies headaches, rhinorrhea, blurry vision, numbness, shortness of breath, chest pain, edema, cough, abdominal pain, nausea,  vomiting, diarrhea, dysuria, fevers, rashes or hallucinations unless otherwise stated above in HPI. ____________________________________________   PHYSICAL EXAM:  VITAL SIGNS: Vitals:   07/09/16 1935  BP: (!) 191/118  Pulse: 74  Resp: 20  Temp: 98 F (36.7 C)    Constitutional: Alert and oriented. Well appearing and in no acute distress. Eyes: Conjunctivae are normal. PERRL. EOMI. Head: Atraumatic. Nose: No congestion/rhinnorhea. Mouth/Throat: Mucous membranes are moist.  Oropharynx non-erythematous. Neck: No stridor. Painless ROM. No cervical spine tenderness to palpation Hematological/Lymphatic/Immunilogical: No cervical lymphadenopathy. Cardiovascular: Normal rate, regular rhythm. Grossly normal heart sounds.  Good peripheral circulation. Respiratory: Normal respiratory effort.  No retractions. Lungs CTAB. Gastrointestinal: Soft TTP in RLQ. No distention. No abdominal bruits. No CVA tenderness. Genitourinary: no hernia, no TTP if scrotum or testes Musculoskeletal: No lower extremity tenderness nor edema.  No joint effusions. Neurologic:  Normal speech and language. No gross focal neurologic deficits are appreciated. No gait instability. Skin:  Skin is warm, dry and intact. No rash noted. Psychiatric: Mood and affect are normal. Speech and behavior are normal.  ____________________________________________   LABS (all labs ordered are listed, but only abnormal results are displayed)  Results for orders placed or performed during the hospital encounter of 07/09/16 (from the past 24 hour(s))  CBC with Differential/Platelet     Status: Abnormal   Collection Time: 07/09/16  7:38 PM  Result Value Ref Range   WBC 19.1 (H) 3.8 -  10.6 K/uL   RBC 5.99 (H) 4.40 - 5.90 MIL/uL   Hemoglobin 16.7 13.0 - 18.0 g/dL   HCT 16.1 09.6 - 04.5 %   MCV 82.8 80.0 - 100.0 fL   MCH 27.9 26.0 - 34.0 pg   MCHC 33.7 32.0 - 36.0 g/dL   RDW 40.9 81.1 - 91.4 %   Platelets 285 150 - 440 K/uL    Neutrophils Relative % 52 %   Neutro Abs 10.0 (H) 1.4 - 6.5 K/uL   Lymphocytes Relative 38 %   Lymphs Abs 7.2 (H) 1.0 - 3.6 K/uL   Monocytes Relative 7 %   Monocytes Absolute 1.4 (H) 0.2 - 1.0 K/uL   Eosinophils Relative 2 %   Eosinophils Absolute 0.3 0 - 0.7 K/uL   Basophils Relative 1 %   Basophils Absolute 0.2 (H) 0 - 0.1 K/uL  Comprehensive metabolic panel     Status: Abnormal   Collection Time: 07/09/16  7:38 PM  Result Value Ref Range   Sodium 140 135 - 145 mmol/L   Potassium 3.8 3.5 - 5.1 mmol/L   Chloride 109 101 - 111 mmol/L   CO2 21 (L) 22 - 32 mmol/L   Glucose, Bld 106 (H) 65 - 99 mg/dL   BUN 13 6 - 20 mg/dL   Creatinine, Ser 7.82 0.61 - 1.24 mg/dL   Calcium 9.2 8.9 - 95.6 mg/dL   Total Protein 7.4 6.5 - 8.1 g/dL   Albumin 4.1 3.5 - 5.0 g/dL   AST 25 15 - 41 U/L   ALT 31 17 - 63 U/L   Alkaline Phosphatase 94 38 - 126 U/L   Total Bilirubin 0.6 0.3 - 1.2 mg/dL   GFR calc non Af Amer >60 >60 mL/min   GFR calc Af Amer >60 >60 mL/min   Anion gap 10 5 - 15  Urinalysis complete, with microscopic ([facility] only)     Status: Abnormal   Collection Time: 07/09/16  7:38 PM  Result Value Ref Range   Color, Urine YELLOW (A) YELLOW   APPearance HAZY (A) CLEAR   Glucose, UA NEGATIVE NEGATIVE mg/dL   Bilirubin Urine NEGATIVE NEGATIVE   Ketones, ur NEGATIVE NEGATIVE mg/dL   Specific Gravity, Urine 1.024 1.005 - 1.030   Hgb urine dipstick 3+ (A) NEGATIVE   pH 5.0 5.0 - 8.0   Protein, ur NEGATIVE NEGATIVE mg/dL   Nitrite NEGATIVE NEGATIVE   Leukocytes, UA NEGATIVE NEGATIVE   RBC / HPF TOO NUMEROUS TO COUNT 0 - 5 RBC/hpf   WBC, UA 0-5 0 - 5 WBC/hpf   Bacteria, UA NONE SEEN NONE SEEN   Squamous Epithelial / LPF 0-5 (A) NONE SEEN  Lipase, blood     Status: None   Collection Time: 07/09/16  7:38 PM  Result Value Ref Range   Lipase 24 11 - 51 U/L   ____________________________________________   ____________________________________________  RADIOLOGY  CT abdomen  pending ____________________________________________   PROCEDURES  Procedure(s) performed: none    Critical Care performed: no ____________________________________________   INITIAL IMPRESSION / ASSESSMENT AND PLAN / ED COURSE  Pertinent labs & imaging results that were available during my care of the patient were reviewed by me and considered in my medical decision making (see chart for details).  DDX: Stone, appendicitis, colitis, hernia, muscle skeletal strain  Jeffery Hendrix is a 40 y.o. who presents to the ED with acute right lower quadrant pain. Patient with tender abdomen but not overtly peritoneal. No significant right CVA tenderness. Patient  otherwise afebrile. Based on his presentation and concern for acute appendicitis will order CT imaging to further evaluate. We'll provide IV pain medications the patient is obviously acute distress. UA ordered from triage does show hematuria but no pyuria or bacteria.  Does have history of kidney stones. Given his abdominal pain though more concerned about appendicitis.  The patient will be placed on continuous pulse oximetry and telemetry for monitoring.  Laboratory evaluation  sent to evaluate for the above complaints.  He does have acute leukocytosis.   Clinical Course  Value Comment By Time  Albumin: 4.1 (Reviewed) Willy EddyPatrick Jerrico Covello, MD 08/24 2102  CT Abdomen Pelvis W Contrast (Reviewed) Willy EddyPatrick Halford Goetzke, MD 08/24 2102   Reassessed patient. States his pain is markedly improved.  Currently awaiting results of CT imaging. Patient also reported that he had pain in his mouth 2 days ago and tried to lance and abscess. Patient has no trismus. On my review does not have any evidence of periapical abscess. His uvula is midline and his airway is clear.  Patient will be signed out to Dr. Don PerkingVeronese pending results of CT imaging.  Have discussed with the patient and available family all diagnostics and treatments performed thus far and all questions  were answered to the best of my ability. The patient demonstrates understanding and agreement with plan.  Willy EddyPatrick Berna Gitto, MD 08/24 2125     ____________________________________________   FINAL CLINICAL IMPRESSION(S) / ED DIAGNOSES  Final diagnoses:  None      NEW MEDICATIONS STARTED DURING THIS VISIT:  New Prescriptions   No medications on file     Note:  This document was prepared using Dragon voice recognition software and may include unintentional dictation errors.    Willy EddyPatrick Lando Alcalde, MD 07/09/16 2130

## 2016-09-08 IMAGING — CT CT ABD-PELV W/ CM
2 of 4 series · 16 of 46 positions shown, 18 images · IV contrast (APPLIED)
Comparison: None.

CLINICAL DATA: Acute right lower quadrant pain with concern for
appendicitis.

EXAM:
CT ABDOMEN AND PELVIS WITH CONTRAST
TECHNIQUE: Multidetector CT imaging of the abdomen and pelvis was performed
using the standard protocol following bolus administration of
intravenous contrast.
CONTRAST:  100mL 228MDO-BAA IOPAMIDOL (228MDO-BAA) INJECTION 61%

[Series 2: axial st · axial · 0.94mm/px · z∈[-632,-82]mm · 13 of 120 slices shown, 15 images]
[im 5/120  soft-tissue]
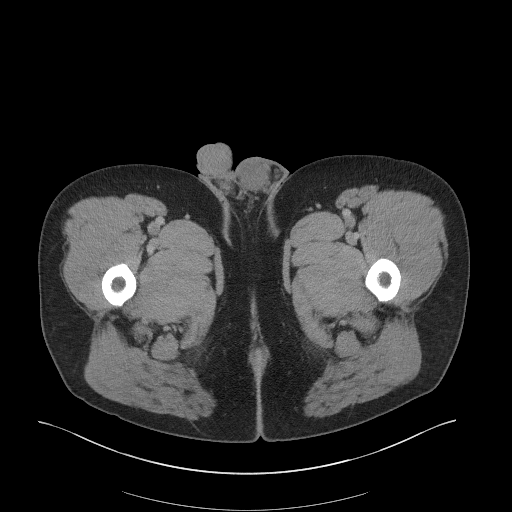
[im 5/120  bone]
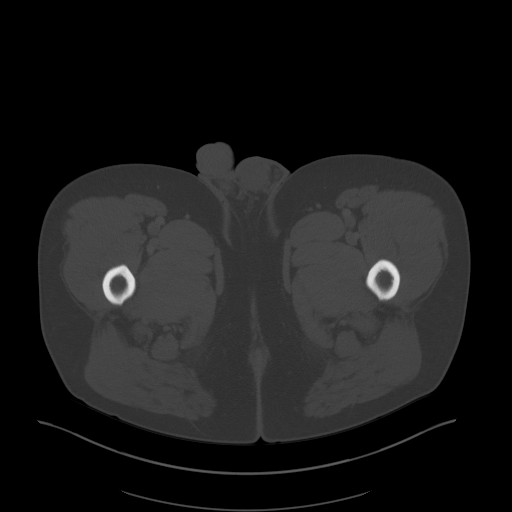
[im 15/120  soft-tissue]
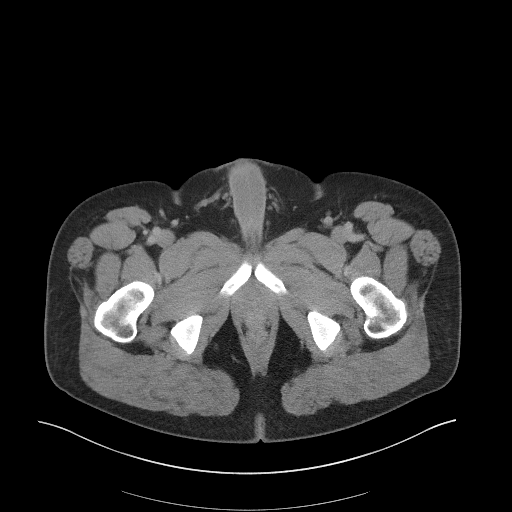
[im 25/120  soft-tissue]
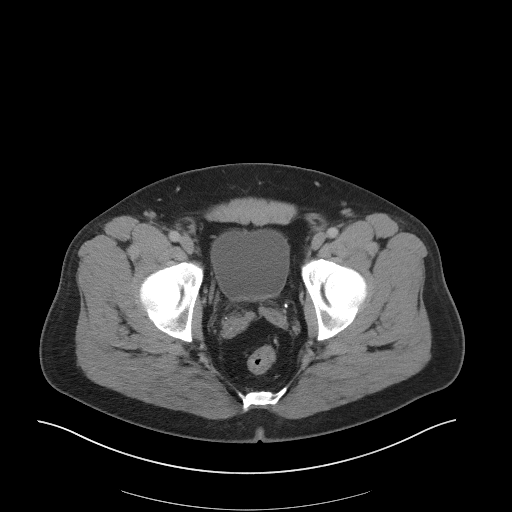
[im 35/120  soft-tissue]
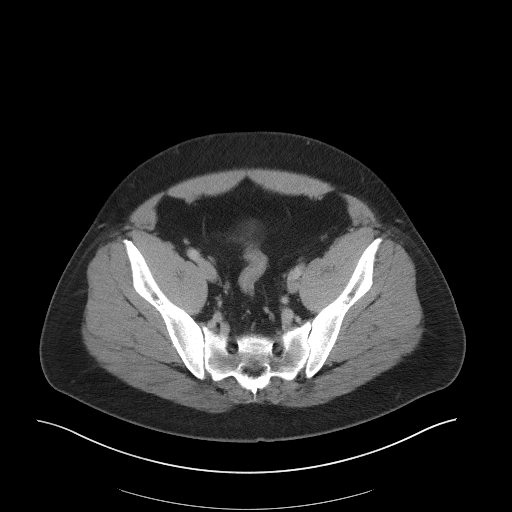
[im 40/120  soft-tissue]
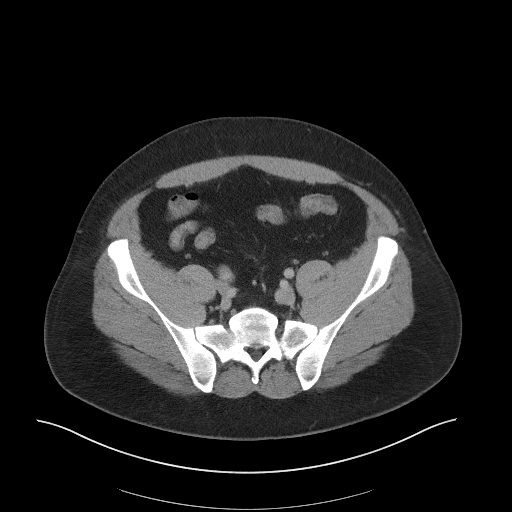
[im 50/120  soft-tissue]
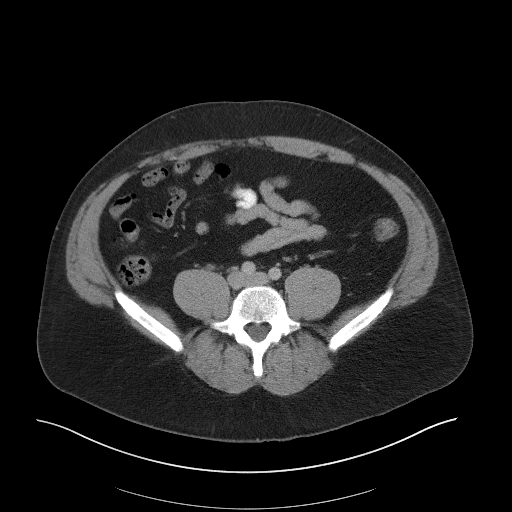
[im 60/120  soft-tissue]
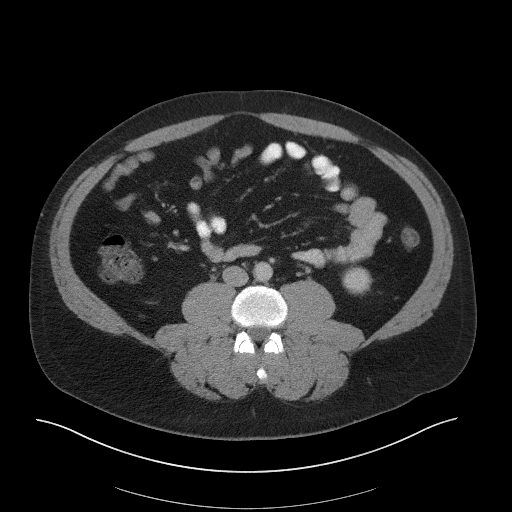
[im 70/120  soft-tissue]
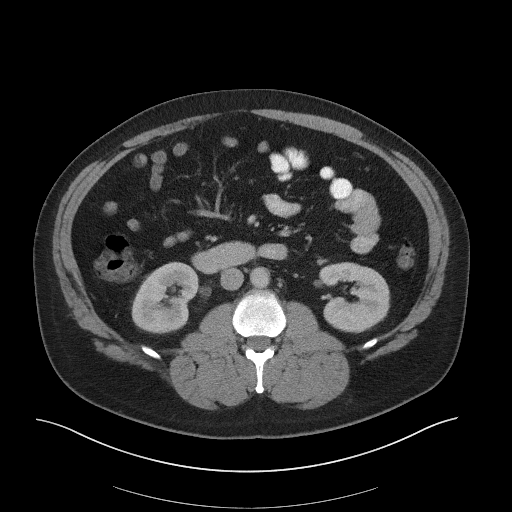
[im 80/120  soft-tissue]
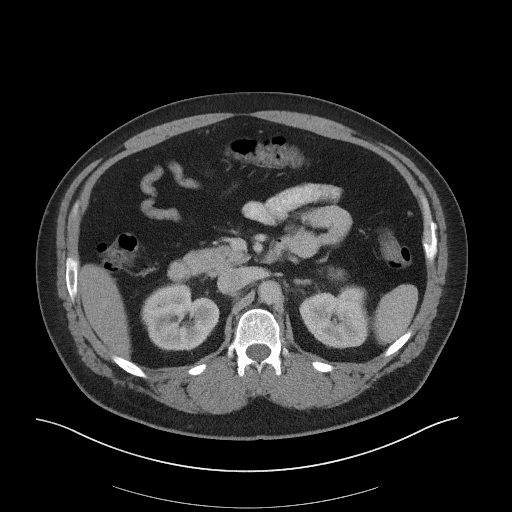
[im 80/120  bone]
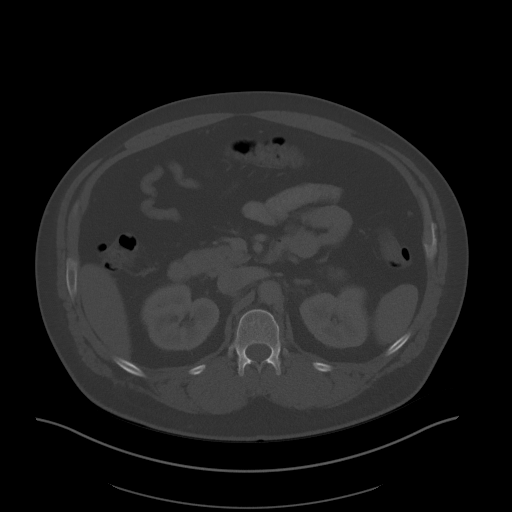
[im 85/120  soft-tissue]
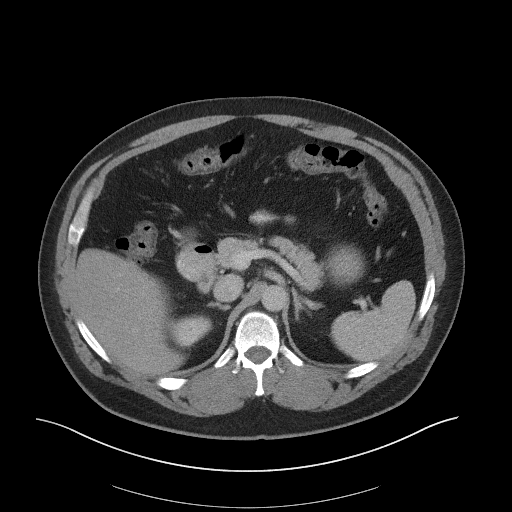
[im 95/120  soft-tissue]
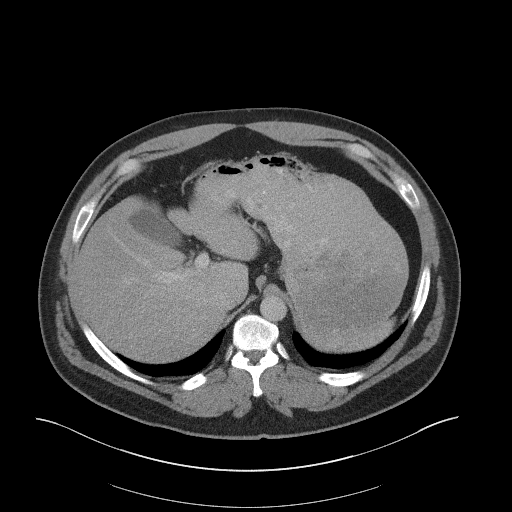
[im 105/120  soft-tissue]
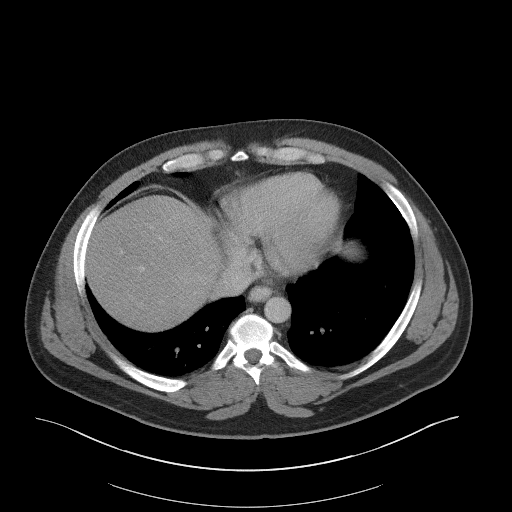
[im 115/120  soft-tissue]
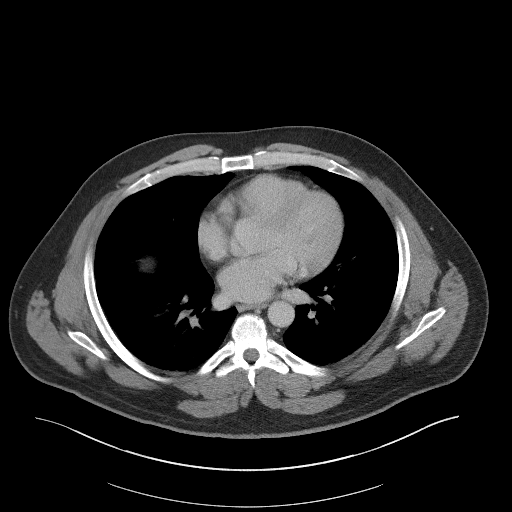

[Series 5: coronal st · coronal · 0.81mm/px · 3 of 107 slices shown]
[im 36/107  soft-tissue]
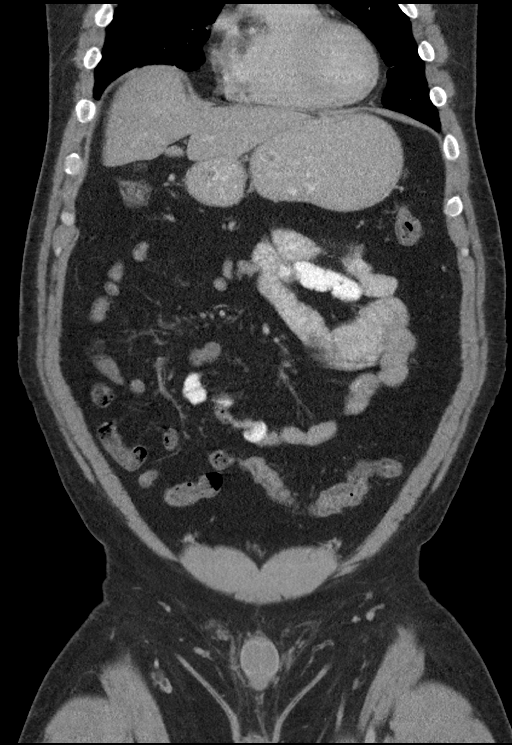
[im 48/107  soft-tissue]
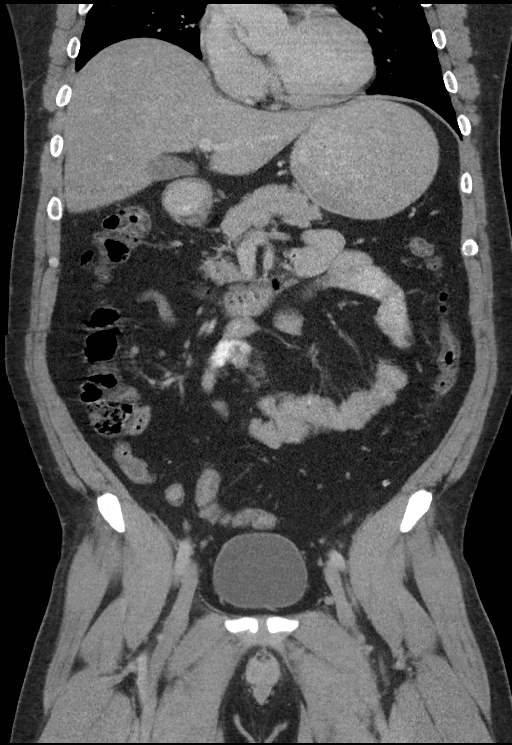
[im 59/107  soft-tissue]
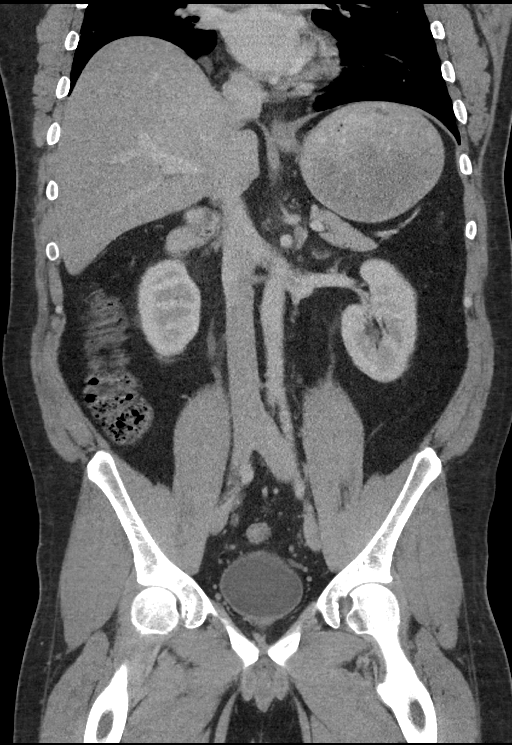

[16 of 46 positions shown; findings below may reference images not displayed]

FINDINGS: Lower chest and abdominal wall: 4 mm right middle lobe pulmonary
nodule.

Hepatobiliary: No focal liver abnormality.No evidence of biliary
obstruction or stone.

Pancreas: Unremarkable.

Spleen: Unremarkable.

Adrenals/Urinary Tract: Negative adrenals. Mild hyper enhancement of
the right ureter with surrounding subtle fat edema. Only seen on
coronal reformats, due to slice selection, is a punctate distal
right ureteral calculus measuring 1 mm. Unremarkable bladder.

Stomach/Bowel:  No obstruction. No appendicitis.

Reproductive:No pathologic findings.

Vascular/Lymphatic: No acute vascular abnormality. No mass or
adenopathy.

Other: No ascites or pneumoperitoneum.

Musculoskeletal: No acute abnormalities.
IMPRESSION: 1. Punctate distal right ureteral calculus that is nonobstructive.
2. No appendicitis.
3. 4 mm right middle lobe pulmonary nodule. Given patient's
extensive smoking history non-contrast chest CT can be considered in
12 months. This recommendation follows the consensus statement:
Guidelines for Management of Incidental Pulmonary Nodules Detected
on CT Images:From the [HOSPITAL] 9246; published online
before print (10.1148/radiol.7010141412).

## 2016-11-30 ENCOUNTER — Encounter: Payer: Self-pay | Admitting: Emergency Medicine

## 2016-11-30 ENCOUNTER — Emergency Department: Payer: Self-pay

## 2016-11-30 ENCOUNTER — Emergency Department
Admission: EM | Admit: 2016-11-30 | Discharge: 2016-11-30 | Disposition: A | Discharge status: Home / Self Care | Payer: Self-pay | Attending: Emergency Medicine | Admitting: Emergency Medicine

## 2016-11-30 DIAGNOSIS — R109 Unspecified abdominal pain: Secondary | ICD-10-CM

## 2016-11-30 DIAGNOSIS — N200 Calculus of kidney: Secondary | ICD-10-CM | POA: Insufficient documentation

## 2016-11-30 DIAGNOSIS — I10 Essential (primary) hypertension: Secondary | ICD-10-CM | POA: Insufficient documentation

## 2016-11-30 DIAGNOSIS — F17210 Nicotine dependence, cigarettes, uncomplicated: Secondary | ICD-10-CM | POA: Insufficient documentation

## 2016-11-30 LAB — LIPASE, BLOOD: Lipase: 58 U/L — ABNORMAL HIGH (ref 11–51)

## 2016-11-30 LAB — COMPREHENSIVE METABOLIC PANEL
ALK PHOS: 88 U/L (ref 38–126)
ALT: 34 U/L (ref 17–63)
ANION GAP: 8 (ref 5–15)
AST: 22 U/L (ref 15–41)
Albumin: 4 g/dL (ref 3.5–5.0)
BILIRUBIN TOTAL: 0.7 mg/dL (ref 0.3–1.2)
BUN: 14 mg/dL (ref 6–20)
CALCIUM: 9.3 mg/dL (ref 8.9–10.3)
CO2: 22 mmol/L (ref 22–32)
Chloride: 112 mmol/L — ABNORMAL HIGH (ref 101–111)
Creatinine, Ser: 0.99 mg/dL (ref 0.61–1.24)
GFR calc non Af Amer: >60 mL/min (ref >60)
GLUCOSE: 104 mg/dL — AB (ref 65–99)
Potassium: 3.7 mmol/L (ref 3.5–5.1)
Sodium: 142 mmol/L (ref 135–145)
TOTAL PROTEIN: 7 g/dL (ref 6.5–8.1)

## 2016-11-30 LAB — CBC
HCT: 47.8 % (ref 40.0–52.0)
HEMOGLOBIN: 16.5 g/dL (ref 13.0–18.0)
MCH: 28.5 pg (ref 26.0–34.0)
MCHC: 34.5 g/dL (ref 32.0–36.0)
MCV: 82.7 fL (ref 80.0–100.0)
PLATELETS: 273 10*3/uL (ref 150–440)
RBC: 5.79 MIL/uL (ref 4.40–5.90)
RDW: 13.9 % (ref 11.5–14.5)
WBC: 15 10*3/uL — ABNORMAL HIGH (ref 3.8–10.6)

## 2016-11-30 MED ORDER — SODIUM CHLORIDE 0.9 % IV BOLUS (SEPSIS)
1000.0000 mL | Freq: Once | INTRAVENOUS | Status: AC
Start: 2016-11-30 — End: 2016-11-30
  Administered 2016-11-30: 1000 mL via INTRAVENOUS

## 2016-11-30 MED ORDER — ONDANSETRON 4 MG PO TBDP
4.0000 mg | ORAL_TABLET | Freq: Three times a day (TID) | ORAL | 0 refills | Status: AC | PRN
Start: 2016-11-30 — End: ?

## 2016-11-30 MED ORDER — ONDANSETRON HCL 4 MG/2ML IJ SOLN
4.0000 mg | Freq: Once | INTRAMUSCULAR | Status: AC
  Administered 2016-11-30: 4 mg via INTRAVENOUS
  Filled 2016-11-30: qty 2

## 2016-11-30 MED ORDER — OXYCODONE-ACETAMINOPHEN 5-325 MG PO TABS: 1.0000 | ORAL_TABLET | Freq: Four times a day (QID) | ORAL | 0 refills | Status: AC | PRN

## 2016-11-30 MED ORDER — HYDROMORPHONE HCL 1 MG/ML IJ SOLN
1.0000 mg | Freq: Once | INTRAMUSCULAR | Status: AC
  Administered 2016-11-30: 1 mg via INTRAVENOUS
  Filled 2016-11-30: qty 1

## 2016-11-30 MED ORDER — TAMSULOSIN HCL 0.4 MG PO CAPS: 0.4000 mg | ORAL_CAPSULE | Freq: Every day | ORAL | 0 refills | Status: AC

## 2016-11-30 MED ORDER — KETOROLAC TROMETHAMINE 30 MG/ML IJ SOLN
30.0000 mg | Freq: Once | INTRAMUSCULAR | Status: AC
  Administered 2016-11-30: 30 mg via INTRAVENOUS
  Filled 2016-11-30: qty 1

## 2016-11-30 MED ORDER — MORPHINE SULFATE (PF) 4 MG/ML IV SOLN
4.0000 mg | Freq: Once | INTRAVENOUS | Status: AC
  Administered 2016-11-30: 4 mg via INTRAVENOUS
  Filled 2016-11-30: qty 1

## 2016-11-30 NOTE — ED Triage Notes (Signed)
Pt arrived via ems with c/o right sided abd pain x 2 hours that radiates down to right groin; states difficulty voiding over the last several days; denies dysuria; denies penile discharge; pt restless and diaphoretic; hypertensive

## 2016-11-30 NOTE — ED Provider Notes (Signed)
[facility] Emergency Department Provider Note   ____________________________________________   First MD Initiated Contact with Patient 11/30/16 0507     (approximate)  I have reviewed the triage vital signs and the nursing notes.   HISTORY  Chief Complaint Abdominal Pain    HPI Jeffery Hendrix is a 41 y.o. male who comes into the hospital today with a concern that he has another kidney stone. The patient reports that a couple of days ago he noticed that he was having some trouble urinating and some mild pains on his right side. He reports though that the sharp pain started about 2 hours prior to his arrival. He reports that shooting down from his side into his groin. He denies any nausea or vomiting at this time but the pain is severe. The patient reports that his last kidney stone was a couple of months ago. He does not have urologist but he is always passed his kidney stones. He reports it is a stabbing pain and he is unable to control it. The patient did not take anything for pain home. The patient rates his pain 8 out of 10 in intensity. He has not had any fevers at home. He is here today for evaluation of these symptoms.   Past Medical History:  Diagnosis Date  . Anxiety   . Hypertension   . LBP (low back pain)   . Obesity     Patient Active Problem List   Diagnosis Date Noted  . Unspecified episodic mood disorder 06/06/2012  . Other and unspecified alcohol dependence, unspecified drinking behavior 06/06/2012  . Cannabis dependence, unspecified 06/06/2012    History reviewed. No pertinent surgical history.  Prior to Admission medications   Medication Sig Start Date End Date Taking? Authorizing Provider  ondansetron (ZOFRAN ODT) 4 MG disintegrating tablet Take 1 tablet (4 mg total) by mouth every 8 (eight) hours as needed for nausea or vomiting. 11/30/16   Rebecka Apley, MD  oxyCODONE-acetaminophen (ROXICET) 5-325 MG tablet Take 1 tablet by  mouth every 6 (six) hours as needed. 11/30/16   Rebecka Apley, MD  tamsulosin (FLOMAX) 0.4 MG CAPS capsule Take 1 capsule (0.4 mg total) by mouth daily. 11/30/16   Rebecka Apley, MD    Allergies Patient has no known allergies.  Family History  Problem Relation Age of Onset  . Alcohol abuse Father   . Alcohol abuse Mother   . Bipolar disorder Mother   . Depression Sister   . Alcohol abuse Brother     Social History Social History  Substance Use Topics  . Smoking status: Current Every Day Smoker    Packs/day: 0.80    Years: 22.00    Types: Cigarettes  . Smokeless tobacco: Never Used  . Alcohol use 0.0 oz/week     Comment: occ    Review of Systems Constitutional: No fever/chills Eyes: No visual changes. ENT: No sore throat. Cardiovascular: Denies chest pain. Respiratory: Denies shortness of breath. Gastrointestinal: No abdominal pain.  No nausea, no vomiting.  No diarrhea.  No constipation. Genitourinary: Negative for dysuria. Musculoskeletal: Right back pain Skin: Negative for rash. Neurological: Negative for headaches, focal weakness or numbness.  10-point ROS otherwise negative.  ____________________________________________   PHYSICAL EXAM:  VITAL SIGNS: ED Triage Vitals  Enc Vitals Group     BP 11/30/16 0456 (!) 192/124     Pulse Rate 11/30/16 0456 74     Resp 11/30/16 0456 (!) 24     Temp  11/30/16 0456 97.6 F (36.4 C)     Temp Source 11/30/16 0456 Oral     SpO2 11/30/16 0456 99 %     Weight 11/30/16 0456 210 lb (95.3 kg)     Height 11/30/16 0456 5\' 6"  (1.676 m)     Head Circumference --      Peak Flow --      Pain Score 11/30/16 0457 8     Pain Loc --      Pain Edu? --      Excl. in GC? --     Constitutional: Alert and oriented. Well appearing and in Moderate distress distress. Eyes: Conjunctivae are normal. PERRL. EOMI. Head: Atraumatic. Nose: No congestion/rhinnorhea. Mouth/Throat: Mucous membranes are moist.  Oropharynx  non-erythematous. Cardiovascular: Normal rate, regular rhythm. Grossly normal heart sounds.  Good peripheral circulation. Respiratory: Normal respiratory effort.  No retractions. Lungs CTAB. Gastrointestinal: Soft and nontender. No distention. Positive bowel sounds pain to the right side and right lower quadrant. Musculoskeletal: No lower extremity tenderness nor edema.   Neurologic:  Normal speech and language.  Skin:  Skin is warm, dry and intact.  Psychiatric: Mood and affect are normal.  ____________________________________________   LABS (all labs ordered are listed, but only abnormal results are displayed)  Labs Reviewed  LIPASE, BLOOD - Abnormal; Notable for the following:       Result Value   Lipase 58 (*)    All other components within normal limits  COMPREHENSIVE METABOLIC PANEL - Abnormal; Notable for the following:    Chloride 112 (*)    Glucose, Bld 104 (*)    All other components within normal limits  CBC - Abnormal; Notable for the following:    WBC 15.0 (*)    All other components within normal limits  URINALYSIS, COMPLETE (UACMP) WITH MICROSCOPIC   ____________________________________________  EKG  none ____________________________________________  RADIOLOGY  CT renal stone ____________________________________________   PROCEDURES  Procedure(s) performed: None  Procedures  Critical Care performed: No  ____________________________________________   INITIAL IMPRESSION / ASSESSMENT AND PLAN / ED COURSE  Pertinent labs & imaging results that were available during my care of the patient were reviewed by me and considered in my medical decision making (see chart for details).  This is a 41 year old male who comes into the hospital today with some right side pain and right lower quadrant pain with a concern for kidney stone. The patient did receive initially morphine and Zofran for his pain. He also received a liter of normal saline. His pain was  still severe so he then received a dose of Dilaudid. I will send the patient for a CT scan and I'll reassess the patient.  Clinical Course as of Nov 30 750  Mon Nov 30, 2016  [id number] 1. Punctate distal right ureteric stone, may be intermittently obstructing. The stone is unchanged in position compared to exam 6 months prior, however there is question of associated ureteral thickening. No additional urolithiasis. 2. Hepatic steatosis. 3. Right middle lobe pulmonary nodules. The 4 mm nodule is unchanged from prior exam. There is no additional 5 mm nodule that was not previously included in the field of view. No follow-up needed if patient is low-risk (and has no known or suspected primary neoplasm). Non-contrast chest CT can be considered in 12 months if patient is high-risk   CT Renal Soundra PilonStone Study [AW]    Clinical Course User Index [AW] Rebecka ApleyAllison P Norvella Loscalzo, MD    The patient is sleeping comfortably at this time.  He'll be discharged home to follow-up with ____________________________________________   FINAL CLINICAL IMPRESSION(S) / ED DIAGNOSES  Final diagnoses:  Flank pain  Kidney stone      NEW MEDICATIONS STARTED DURING THIS VISIT:  New Prescriptions   ONDANSETRON (ZOFRAN ODT) 4 MG DISINTEGRATING TABLET    Take 1 tablet (4 mg total) by mouth every 8 (eight) hours as needed for nausea or vomiting.   OXYCODONE-ACETAMINOPHEN (ROXICET) 5-325 MG TABLET    Take 1 tablet by mouth every 6 (six) hours as needed.   TAMSULOSIN (FLOMAX) 0.4 MG CAPS CAPSULE    Take 1 capsule (0.4 mg total) by mouth daily.     Note:  This document was prepared using Dragon voice recognition software and may include unintentional dictation errors.    Rebecka Apley, MD 11/30/16 [phone]

## 2016-11-30 NOTE — ED Notes (Signed)
Pt with pain to right side of his abd radiating down into groin; pt says he had a kidney stone a few months ago but never saw a urologist; pt says this feels similar but pain is worse than last time

## 2016-11-30 NOTE — ED Notes (Signed)
Patient transported to CT 

## 2017-11-12 ENCOUNTER — Emergency Department: Payer: Self-pay

## 2017-11-12 ENCOUNTER — Encounter: Payer: Self-pay | Admitting: Emergency Medicine

## 2017-11-12 ENCOUNTER — Other Ambulatory Visit: Payer: Self-pay

## 2017-11-12 ENCOUNTER — Emergency Department
Admission: EM | Admit: 2017-11-12 | Discharge: 2017-11-12 | Disposition: A | Discharge status: Home / Self Care | Payer: Self-pay | Attending: Emergency Medicine | Admitting: Emergency Medicine

## 2017-11-12 DIAGNOSIS — J029 Acute pharyngitis, unspecified: Secondary | ICD-10-CM | POA: Insufficient documentation

## 2017-11-12 DIAGNOSIS — Z79899 Other long term (current) drug therapy: Secondary | ICD-10-CM | POA: Insufficient documentation

## 2017-11-12 DIAGNOSIS — R600 Localized edema: Secondary | ICD-10-CM | POA: Insufficient documentation

## 2017-11-12 DIAGNOSIS — R609 Edema, unspecified: Secondary | ICD-10-CM

## 2017-11-12 DIAGNOSIS — F17210 Nicotine dependence, cigarettes, uncomplicated: Secondary | ICD-10-CM | POA: Insufficient documentation

## 2017-11-12 DIAGNOSIS — I10 Essential (primary) hypertension: Secondary | ICD-10-CM | POA: Insufficient documentation

## 2017-11-12 LAB — GROUP A STREP BY PCR: Group A Strep by PCR: NOT DETECTED

## 2017-11-12 MED ORDER — METHYLPREDNISOLONE 4 MG PO TBPK: ORAL_TABLET | ORAL | 0 refills | Status: AC

## 2017-11-12 NOTE — Discharge Instructions (Signed)
Apply ice pack to area 3 times a day for 5 minutes. Take medication as directed

## 2017-11-12 NOTE — ED Triage Notes (Signed)
FIRST NURSE NOTE-here for sore throat and facial swelling after eating breakfast. No respiratory distress.  Handling secretion.

## 2017-11-12 NOTE — ED Provider Notes (Signed)
[facility] Emergency Department Provider Note   ____________________________________________   First MD Initiated Contact with Patient 11/12/17 1324     (approximate)  I have reviewed the triage vital signs and the nursing notes.   HISTORY  Chief Complaint Sore Throat    HPI Jeffery Hendrix is a 41 y.o. male patient complaining of sore throat for 2 days. Patient stated he had onset of acute swelling to the left lateral maxillary area while eating breakfast this morning. Patient denies dental issues. Patient denies dysphagia. Patient rates his pain as a 1/10. No palliative measures for complaint.   Past Medical History:  Diagnosis Date  . Anxiety   . Hypertension   . LBP (low back pain)   . Obesity     Patient Active Problem List   Diagnosis Date Noted  . Unspecified episodic mood disorder 06/06/2012  . Other and unspecified alcohol dependence, unspecified drinking behavior 06/06/2012  . Cannabis dependence, unspecified 06/06/2012    History reviewed. No pertinent surgical history.  Prior to Admission medications   Medication Sig Start Date End Date Taking? Authorizing Provider  methylPREDNISolone (MEDROL DOSEPAK) 4 MG TBPK tablet Take Tapered dose as directed 11/12/17   Joni ReiningSmith, Cheyna Retana K, PA-C  ondansetron (ZOFRAN ODT) 4 MG disintegrating tablet Take 1 tablet (4 mg total) by mouth every 8 (eight) hours as needed for nausea or vomiting. 11/30/16   Rebecka ApleyWebster, Allison P, MD  oxyCODONE-acetaminophen (ROXICET) 5-325 MG tablet Take 1 tablet by mouth every 6 (six) hours as needed. 11/30/16   Rebecka ApleyWebster, Allison P, MD  tamsulosin (FLOMAX) 0.4 MG CAPS capsule Take 1 capsule (0.4 mg total) by mouth daily. 11/30/16   Rebecka ApleyWebster, Allison P, MD    Allergies Patient has no known allergies.  Family History  Problem Relation Age of Onset  . Alcohol abuse Father   . Alcohol abuse Mother   . Bipolar disorder Mother   . Depression Sister   . Alcohol abuse Brother       Social History Social History   Tobacco Use  . Smoking status: Current Every Day Smoker    Packs/day: 0.80    Years: 22.00    Pack years: 17.60    Types: Cigarettes  . Smokeless tobacco: Never Used  Substance Use Topics  . Alcohol use: Yes    Alcohol/week: 0.0 oz    Comment: occ  . Drug use: Yes    Types: Cocaine, Marijuana    Comment: Hx of frequent THC use, has been abstinent for several weeks    Review of Systems  Constitutional: No fever/chills Eyes: No visual changes. ENT: No sore throat. Cardiovascular: Denies chest pain. Respiratory: Denies shortness of breath. Gastrointestinal: No abdominal pain.  No nausea, no vomiting.  No diarrhea.  No constipation. Genitourinary: Negative for dysuria. Musculoskeletal: Positive for back pain. Skin: Negative for rash. Neurological: Negative for headaches, focal weakness or numbness. Psychiatric:Anxiety and depression. Cannabis dependency and alcohol abuse Endocrine:Hypertension ____________________________________________   PHYSICAL EXAM:  VITAL SIGNS: ED Triage Vitals  Enc Vitals Group     BP 11/12/17 1257 (!) 150/108     Pulse Rate 11/12/17 1257 92     Resp 11/12/17 1257 20     Temp 11/12/17 1257 98 F (36.7 C)     Temp Source 11/12/17 1257 Oral     SpO2 11/12/17 1257 100 %     Weight 11/12/17 1256 210 lb (95.3 kg)     Height 11/12/17 1256 5\' 6"  (1.676 m)  Head Circumference --      Peak Flow --      Pain Score 11/12/17 1256 1     Pain Loc --      Pain Edu? --      Excl. in GC? --     Constitutional: Alert and oriented. Well appearing and in no acute distress. Eyes: Conjunctivae are normal. PERRL. EOMI. Head: Atraumatic. Nose: No congestion/rhinnorhea. Mouth/Throat: Mucous membranes are moist.  Oropharynx erythematous. Left lateral maxillary edema Neck: No stridor.  Hematological/Lymphatic/Immunilogical: No cervical lymphadenopathy. Cardiovascular: Normal rate, regular rhythm. Grossly normal heart  sounds.  Good peripheral circulation. Respiratory: Normal respiratory effort.  No retractions. Lungs CTAB. Skin:  Skin is warm, dry and intact. No rash noted. Psychiatric: Mood and affect are normal. Speech and behavior are normal.  ____________________________________________   LABS (all labs ordered are listed, but only abnormal results are displayed)  Labs Reviewed  GROUP A STREP BY PCR   ____________________________________________  EKG   ____________________________________________  RADIOLOGY  [place] Soft Tissue Head & Neck (non-thyroid)  Result Date: 11/12/2017 CLINICAL DATA:  Acute left lateral maxillary edema postprandial. EXAM: ULTRASOUND OF HEAD/NECK SOFT TISSUES TECHNIQUE: Ultrasound examination of the head and neck soft tissues was performed in the area of clinical concern. COMPARISON:  None. FINDINGS: No mass, cyst, aneurysm, adenopathy, or abscess on survey ultrasound interrogation of the region of concern. Contralateral limited right images are unremarkable. IMPRESSION: No pathologic findings on ultrasound. Electronically Signed   By: Corlis Leak  Hassell M.D.   On: 11/12/2017 14:42    ____________________________________________   PROCEDURES  Procedure(s) performed: None  Procedures  Critical Care performed: No  ____________________________________________   INITIAL IMPRESSION / ASSESSMENT AND PLAN / ED COURSE  As part of my medical decision making, I reviewed the following data within the electronic MEDICAL RECORD NUMBER    Sore throat and left lateral facial edema. Scars negative strep results with patient. Discussed negative ultrasound findings with patient. Patient given discharge care instructions and a prescription for Motrin dosepak. Patient advised to follow-up with PCP or discussed Department in 3 days if no improvement. Return to ED if condition worsens.      ____________________________________________   FINAL CLINICAL IMPRESSION(S) / ED  DIAGNOSES  Final diagnoses:  Edema  Sore throat  Facial edema     ED Discharge Orders        Ordered    methylPREDNISolone (MEDROL DOSEPAK) 4 MG TBPK tablet     11/12/17 1454       Note:  This document was prepared using Dragon voice recognition software and may include unintentional dictation errors.    Joni ReiningSmith, Hephzibah Strehle K, PA-C 11/12/17 1507    Jeanmarie PlantMcShane, James A, MD 11/13/17 1115

## 2017-11-12 NOTE — ED Triage Notes (Signed)
Presents with sore throat and jaw pain states pain started this am after eating breakfast some facial swelling noted

## 2017-11-12 NOTE — ED Notes (Signed)
Patient transported to Ultrasound 

## 2017-11-19 ENCOUNTER — Emergency Department: Payer: Self-pay

## 2017-11-19 ENCOUNTER — Emergency Department
Admission: EM | Admit: 2017-11-19 | Discharge: 2017-11-19 | Disposition: A | Discharge status: Home / Self Care | Payer: Self-pay | Attending: Emergency Medicine | Admitting: Emergency Medicine

## 2017-11-19 ENCOUNTER — Other Ambulatory Visit: Payer: Self-pay

## 2017-11-19 ENCOUNTER — Encounter: Payer: Self-pay | Admitting: Emergency Medicine

## 2017-11-19 DIAGNOSIS — F17210 Nicotine dependence, cigarettes, uncomplicated: Secondary | ICD-10-CM | POA: Insufficient documentation

## 2017-11-19 DIAGNOSIS — A084 Viral intestinal infection, unspecified: Secondary | ICD-10-CM | POA: Insufficient documentation

## 2017-11-19 DIAGNOSIS — I10 Essential (primary) hypertension: Secondary | ICD-10-CM | POA: Insufficient documentation

## 2017-11-19 DIAGNOSIS — R109 Unspecified abdominal pain: Secondary | ICD-10-CM

## 2017-11-19 LAB — CBC
HEMATOCRIT: 59.7 % — AB (ref 40.0–52.0)
HEMOGLOBIN: 19.9 g/dL — AB (ref 13.0–18.0)
MCH: 27.9 pg (ref 26.0–34.0)
MCHC: 33.3 g/dL (ref 32.0–36.0)
MCV: 83.9 fL (ref 80.0–100.0)
Platelets: 326 10*3/uL (ref 150–440)
RBC: 7.13 MIL/uL — ABNORMAL HIGH (ref 4.40–5.90)
RDW: 14.4 % (ref 11.5–14.5)
WBC: 16.8 10*3/uL — AB (ref 3.8–10.6)

## 2017-11-19 LAB — COMPREHENSIVE METABOLIC PANEL
ALBUMIN: 4.8 g/dL (ref 3.5–5.0)
ALT: 37 U/L (ref 17–63)
ANION GAP: 11 (ref 5–15)
AST: 28 U/L (ref 15–41)
Alkaline Phosphatase: 116 U/L (ref 38–126)
BILIRUBIN TOTAL: 1 mg/dL (ref 0.3–1.2)
BUN: 26 mg/dL — ABNORMAL HIGH (ref 6–20)
CHLORIDE: 106 mmol/L (ref 101–111)
CO2: 18 mmol/L — ABNORMAL LOW (ref 22–32)
Calcium: 10.5 mg/dL — ABNORMAL HIGH (ref 8.9–10.3)
Creatinine, Ser: 1.2 mg/dL (ref 0.61–1.24)
GFR calc Af Amer: >60 mL/min (ref >60)
GFR calc non Af Amer: >60 mL/min (ref >60)
GLUCOSE: 124 mg/dL — AB (ref 65–99)
POTASSIUM: 4 mmol/L (ref 3.5–5.1)
Sodium: 135 mmol/L (ref 135–145)
TOTAL PROTEIN: 8.7 g/dL — AB (ref 6.5–8.1)

## 2017-11-19 LAB — URINALYSIS, COMPLETE (UACMP) WITH MICROSCOPIC
BACTERIA UA: NONE SEEN
BILIRUBIN URINE: NEGATIVE
Glucose, UA: NEGATIVE mg/dL
Hgb urine dipstick: NEGATIVE
KETONES UR: NEGATIVE mg/dL
LEUKOCYTES UA: NEGATIVE
NITRITE: NEGATIVE
PROTEIN: NEGATIVE mg/dL
Specific Gravity, Urine: >1.046 — ABNORMAL HIGH (ref 1.005–1.030)
pH: 5 (ref 5.0–8.0)

## 2017-11-19 LAB — LIPASE, BLOOD: LIPASE: 25 U/L (ref 11–51)

## 2017-11-19 LAB — TROPONIN I

## 2017-11-19 MED ORDER — GI COCKTAIL ~~LOC~~
30.0000 mL | Freq: Once | ORAL | Status: AC
  Administered 2017-11-19: 30 mL via ORAL
  Filled 2017-11-19: qty 30

## 2017-11-19 MED ORDER — DICYCLOMINE HCL 10 MG PO CAPS: 10.0000 mg | ORAL_CAPSULE | Freq: Four times a day (QID) | ORAL | 0 refills | Status: AC

## 2017-11-19 MED ORDER — IOPAMIDOL (ISOVUE-300) INJECTION 61%
30.0000 mL | Freq: Once | INTRAVENOUS | Status: AC | PRN
  Administered 2017-11-19: 30 mL via ORAL

## 2017-11-19 MED ORDER — MORPHINE SULFATE (PF) 4 MG/ML IV SOLN
4.0000 mg | Freq: Once | INTRAVENOUS | Status: AC
  Administered 2017-11-19: 4 mg via INTRAVENOUS
  Filled 2017-11-19: qty 1

## 2017-11-19 MED ORDER — IOPAMIDOL (ISOVUE-300) INJECTION 61%
100.0000 mL | Freq: Once | INTRAVENOUS | Status: AC | PRN
  Administered 2017-11-19: 100 mL via INTRAVENOUS

## 2017-11-19 MED ORDER — SODIUM CHLORIDE 0.9 % IV BOLUS (SEPSIS)
1000.0000 mL | Freq: Once | INTRAVENOUS | Status: AC
  Administered 2017-11-19: 1000 mL via INTRAVENOUS

## 2017-11-19 MED ORDER — ONDANSETRON HCL 4 MG PO TABS: 4.0000 mg | ORAL_TABLET | Freq: Three times a day (TID) | ORAL | 0 refills | Status: AC | PRN

## 2017-11-19 MED ORDER — SODIUM CHLORIDE 0.9 % IV BOLUS (SEPSIS)
1000.0000 mL | Freq: Once | INTRAVENOUS | Status: AC
Start: 2017-11-19 — End: 2017-11-19
  Administered 2017-11-19: 1000 mL via INTRAVENOUS

## 2017-11-19 MED ORDER — FENTANYL CITRATE (PF) 100 MCG/2ML IJ SOLN
50.0000 ug | Freq: Once | INTRAMUSCULAR | Status: AC
Start: 2017-11-19 — End: 2017-11-19
  Administered 2017-11-19: 50 ug via INTRAVENOUS
  Filled 2017-11-19: qty 2

## 2017-11-19 MED ORDER — ONDANSETRON HCL 4 MG/2ML IJ SOLN
4.0000 mg | Freq: Once | INTRAMUSCULAR | Status: AC
  Administered 2017-11-19: 4 mg via INTRAVENOUS
  Filled 2017-11-19: qty 2

## 2017-11-19 NOTE — ED Triage Notes (Signed)
Here for epigastric/upper abdominal pain X 2 days with vomiting and diarrhea. Reports fever other day of 101 but only chills since. NAD. Ambulatory.  "I have not peed in 2 days". Pt denies any urination in last 2 days.  Unlabored. Will perform bladder scan in triage. Pt also thinks he has the flu

## 2017-11-19 NOTE — ED Provider Notes (Signed)
[facility] Emergency Department Provider Note  ____________________________________________   I have reviewed the triage vital signs and the nursing notes. Where available I have reviewed prior notes and, if possible and indicated, outside hospital notes.    HISTORY  Chief Complaint Abdominal Pain    HPI Jeffery Hendrix is a 42 y.o. male who presents here today complaining of nausea vomiting diarrhea and abdominal pain.  Patient has had a fever at home.  He denies any focal pain he states he is getting mostly a burning abdominal epigastric discomfort.  He states however the pain is also diffuse.  Has had nausea vomiting diarrhea he states that there is no blood no melena no bright red blood per rectum no hematemesis etc.  Multiple family members over the last couple weeks have been sick with similar.  He denies chest pain or shortness of breath, no leg swelling.  Patient states he had decreased p.o. and urinary output because of the symptoms.  Vomited twice today.   Location: Diffuse abdominal pain Radiation: None Quality: Burning and aching worse with vomiting Duration: "A few days" Timing: Vomiting worse, otherwise is been there pretty constantly Severity: Moderate Associated sxs: Nausea vomiting diarrhea PriorTreatment : None   Past Medical History:  Diagnosis Date  . Anxiety   . Hypertension   . LBP (low back pain)   . Obesity     Patient Active Problem List   Diagnosis Date Noted  . Unspecified episodic mood disorder 06/06/2012  . Other and unspecified alcohol dependence, unspecified drinking behavior 06/06/2012  . Cannabis dependence, unspecified 06/06/2012    History reviewed. No pertinent surgical history.  Prior to Admission medications   Medication Sig Start Date End Date Taking? Authorizing Provider  methylPREDNISolone (MEDROL DOSEPAK) 4 MG TBPK tablet Take Tapered dose as directed Patient not taking: Reported on 11/19/2017 11/12/17    Joni Reining, PA-C  ondansetron (ZOFRAN ODT) 4 MG disintegrating tablet Take 1 tablet (4 mg total) by mouth every 8 (eight) hours as needed for nausea or vomiting. Patient not taking: Reported on 11/19/2017 11/30/16   Rebecka Apley, MD  oxyCODONE-acetaminophen (ROXICET) 5-325 MG tablet Take 1 tablet by mouth every 6 (six) hours as needed. Patient not taking: Reported on 11/19/2017 11/30/16   Rebecka Apley, MD  tamsulosin (FLOMAX) 0.4 MG CAPS capsule Take 1 capsule (0.4 mg total) by mouth daily. Patient not taking: Reported on 11/19/2017 11/30/16   Rebecka Apley, MD    Allergies Patient has no known allergies.  Family History  Problem Relation Age of Onset  . Alcohol abuse Father   . Alcohol abuse Mother   . Bipolar disorder Mother   . Depression Sister   . Alcohol abuse Brother     Social History Social History   Tobacco Use  . Smoking status: Current Every Day Smoker    Packs/day: 0.80    Years: 22.00    Pack years: 17.60    Types: Cigarettes  . Smokeless tobacco: Never Used  Substance Use Topics  . Alcohol use: Yes    Alcohol/week: 0.0 oz    Comment: occ  . Drug use: Yes    Types: Cocaine, Marijuana    Comment: Hx of frequent THC use, has been abstinent for several weeks    Review of Systems Constitutional: No fever/chills Eyes: No visual changes. ENT: No sore throat. No stiff neck no neck pain Cardiovascular: Denies chest pain. Respiratory: Denies shortness of breath. Gastrointestinal:   See HPI  Genitourinary: Negative for dysuria. Musculoskeletal: Negative lower extremity swelling Skin: Negative for rash. Neurological: Negative for severe headaches, focal weakness or numbness.   ____________________________________________   PHYSICAL EXAM:  VITAL SIGNS: ED Triage Vitals  Enc Vitals Group     BP 11/19/17 1007 (!) 159/108     Pulse Rate 11/19/17 1007 (!) 106     Resp --      Temp 11/19/17 1007 97.7 F (36.5 C)     Temp Source 11/19/17 1007  Oral     SpO2 11/19/17 1007 98 %     Weight 11/19/17 0933 210 lb (95.3 kg)     Height 11/19/17 0933 5\' 6"  (1.676 m)     Head Circumference --      Peak Flow --      Pain Score 11/19/17 0933 8     Pain Loc --      Pain Edu? --      Excl. in GC? --     Constitutional: Alert and oriented. Well appearing and in no acute distress. Eyes: Conjunctivae are normal Head: Atraumatic HEENT: No congestion/rhinnorhea. Mucous membranes are moist.  Oropharynx non-erythematous Neck:   Nontender with no meningismus, no masses, no stridor Cardiovascular: Normal rate, regular rhythm. Grossly normal heart sounds.  Good peripheral circulation. Respiratory: Normal respiratory effort.  No retractions. Lungs CTAB. Abdominal: Soft and easily tender with voluntary guarding, in all quadrants. No distention. no rebound Back:  There is no focal tenderness or step off.  there is no midline tenderness there are no lesions noted. there is no CVA tenderness Musculoskeletal: No lower extremity tenderness, no upper extremity tenderness. No joint effusions, no DVT signs strong distal pulses no edema Neurologic:  Normal speech and language. No gross focal neurologic deficits are appreciated.  Skin:  Skin is warm, dry and intact. No rash noted. Psychiatric: Mood and affect are normal. Speech and behavior are normal.  ____________________________________________   LABS (all labs ordered are listed, but only abnormal results are displayed)  Labs Reviewed  COMPREHENSIVE METABOLIC PANEL - Abnormal; Notable for the following components:      Result Value   CO2 18 (*)    Glucose, Bld 124 (*)    BUN 26 (*)    Calcium 10.5 (*)    Total Protein 8.7 (*)    All other components within normal limits  CBC - Abnormal; Notable for the following components:   WBC 16.8 (*)    RBC 7.13 (*)    Hemoglobin 19.9 (*)    HCT 59.7 (*)    All other components within normal limits  LIPASE, BLOOD  URINALYSIS, COMPLETE (UACMP) WITH  MICROSCOPIC    Pertinent labs  results that were available during my care of the patient were reviewed by me and considered in my medical decision making (see chart for details). ____________________________________________  EKG  I personally interpreted any EKGs ordered by me or triage Sinus tachycardia rate 113, LAD noted no acute ST elevation or depression, ____________________________________________  RADIOLOGY  Pertinent labs & imaging results that were available during my care of the patient were reviewed by me and considered in my medical decision making (see chart for details). If possible, patient and/or family made aware of any abnormal findings.  No results found. ____________________________________________    PROCEDURES  Procedure(s) performed: None  Procedures  Critical Care performed: None  ____________________________________________   INITIAL IMPRESSION / ASSESSMENT AND PLAN / ED COURSE  Pertinent labs & imaging results that were available during my  care of the patient were reviewed by me and considered in my medical decision making (see chart for details).  Here with nausea vomiting and diarrhea but he has diffuse abdominal pain differential includes diverticulitis viral syndrome appendicitis etc.  We will obtain CT scan given degree of tenderness but I do suspect this is most likely viral.  Patient also has what he describes a decreased urinary output but creatinine is well preserved, BUN is elevated suggesting some dehydrated we will give him IV fluid.  He is mildly acidotic on his blood work which I think will resolve with fluids, white count is 16.8 but we have never seen this patient without a white count of at least 15 in the last 6 years.  Certainly all that could still be consistent with viral dehydration.    ____________________________________________   FINAL CLINICAL IMPRESSION(S) / ED DIAGNOSES  Final diagnoses:  None      This chart was  dictated using voice recognition software.  Despite best efforts to proofread,  errors can occur which can change meaning.      Jeanmarie PlantMcShane, Terrence Pizana A, MD 11/19/17 1153

## 2018-01-19 IMAGING — CT CT ABD-PELV W/ CM
2 of 5 series · 16 of 46 positions shown, 18 images · IV contrast (APPLIED)
Comparison: CT abdomen and pelvis without contrast 12/10/2016. CT
abdomen and pelvis with contrast 05/09/2016.

CLINICAL DATA: Epigastric and upper abdominal pain for 2 days.
Fever.

EXAM:
CT ABDOMEN AND PELVIS WITH CONTRAST
TECHNIQUE: Multidetector CT imaging of the abdomen and pelvis was performed
using the standard protocol following bolus administration of
intravenous contrast.
CONTRAST:  100mL [id number] IOPAMIDOL ([id number]) INJECTION 61%

[Series 2: axial st · axial · 0.88mm/px · z∈[-556,-81]mm · 13 of 107 slices shown, 15 images]
[im 6/107  soft-tissue]
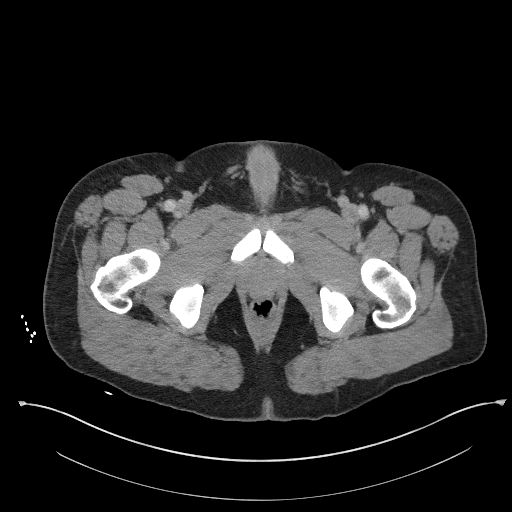
[im 6/107  bone]
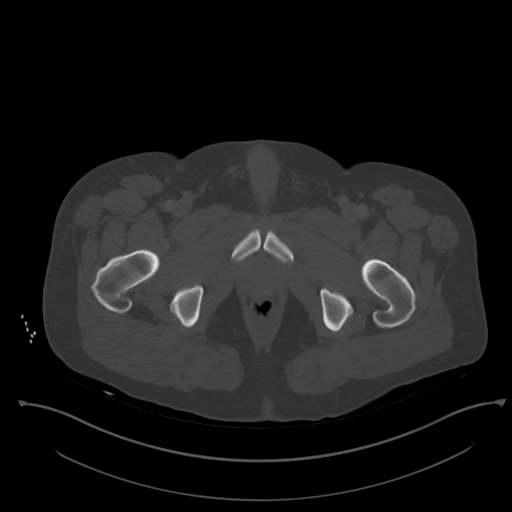
[im 16/107  soft-tissue]
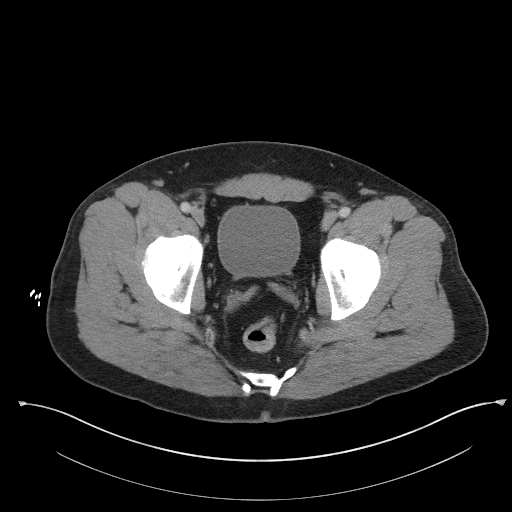
[im 22/107  soft-tissue]
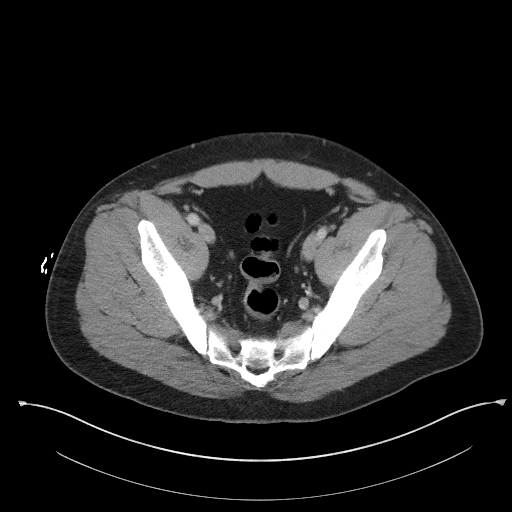
[im 32/107  soft-tissue]
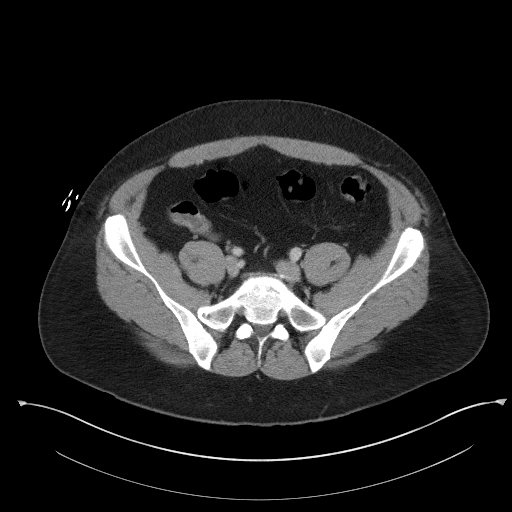
[im 38/107  soft-tissue]
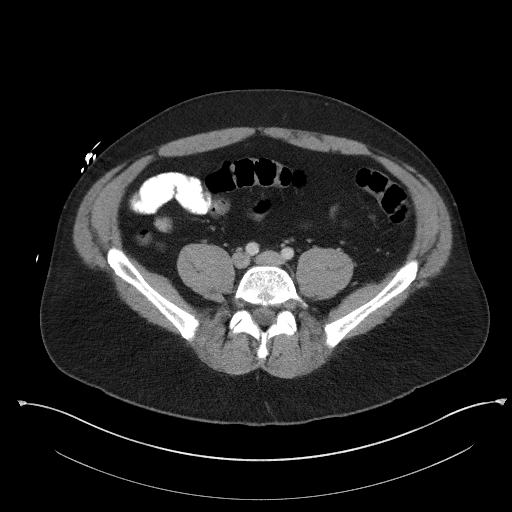
[im 48/107  soft-tissue]
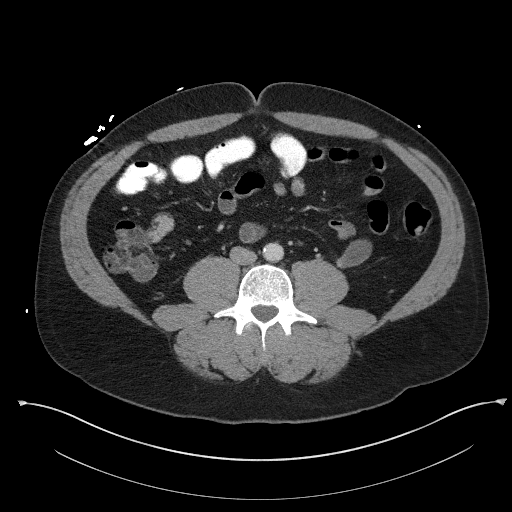
[im 54/107  soft-tissue]
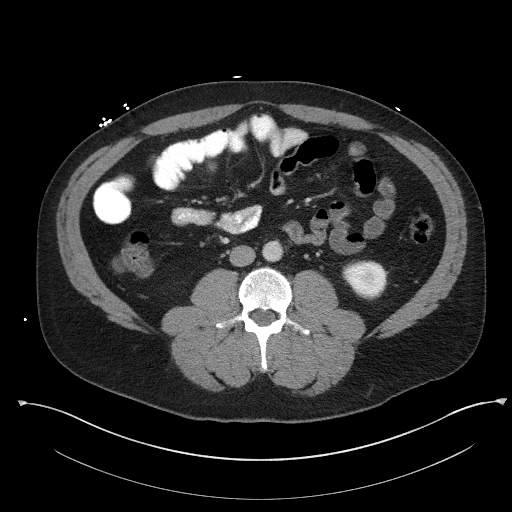
[im 59/107  soft-tissue]
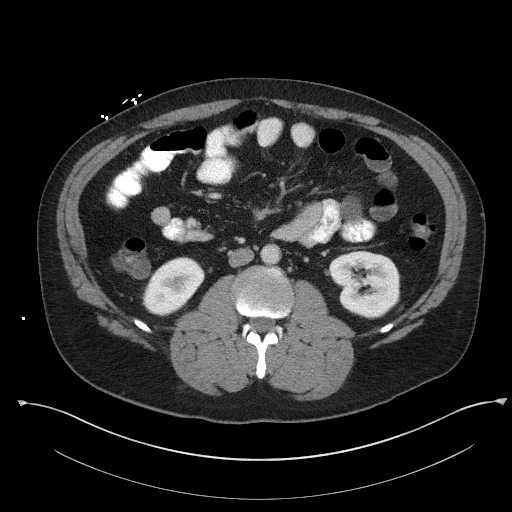
[im 69/107  soft-tissue]
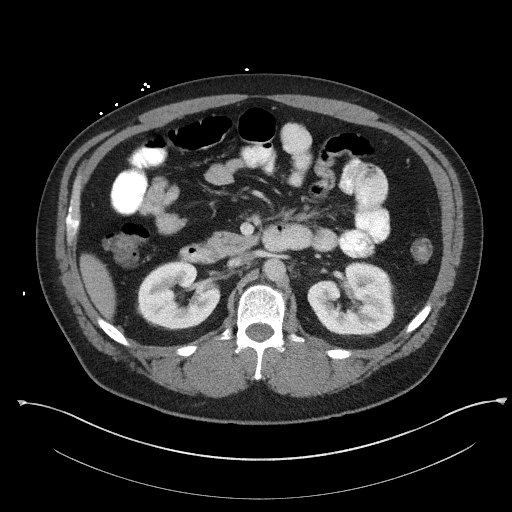
[im 69/107  bone]
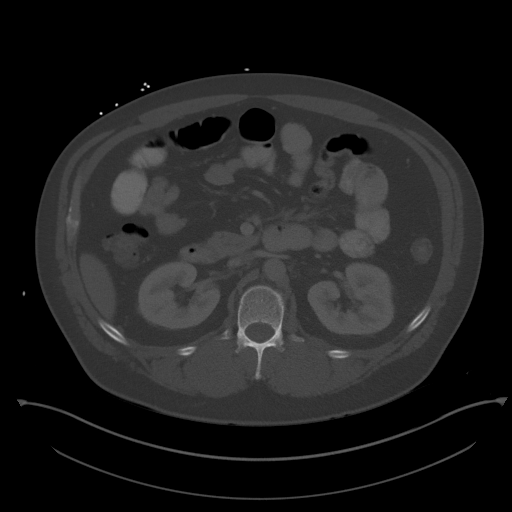
[im 75/107  soft-tissue]
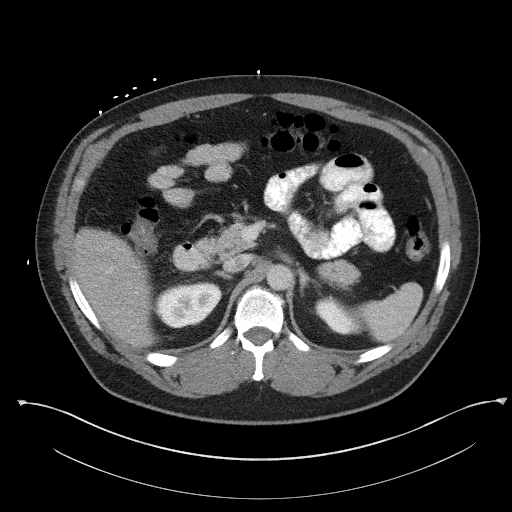
[im 85/107  soft-tissue]
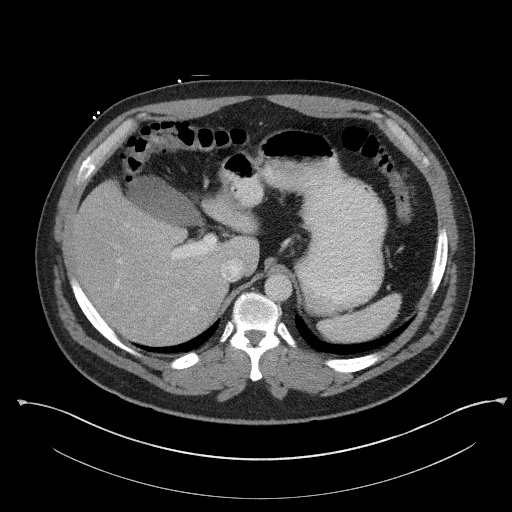
[im 91/107  soft-tissue]
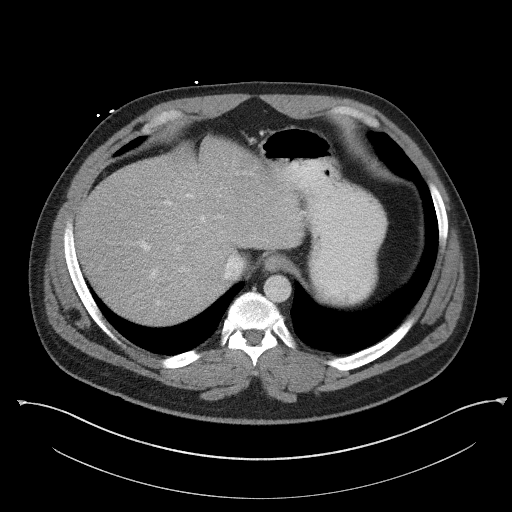
[im 101/107  soft-tissue]
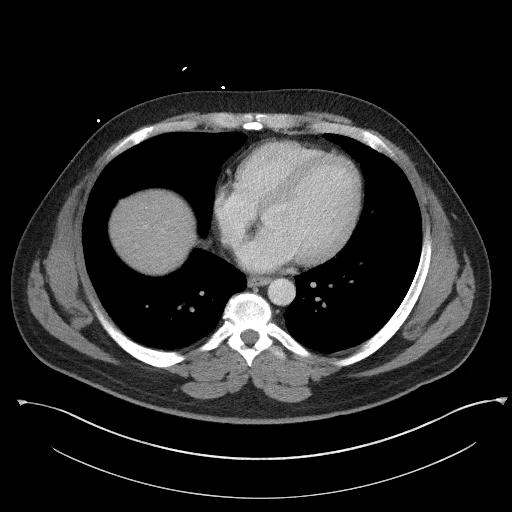

[Series 7: coronal st · coronal · 0.85mm/px · 3 of 104 slices shown]
[im 35/104  soft-tissue]
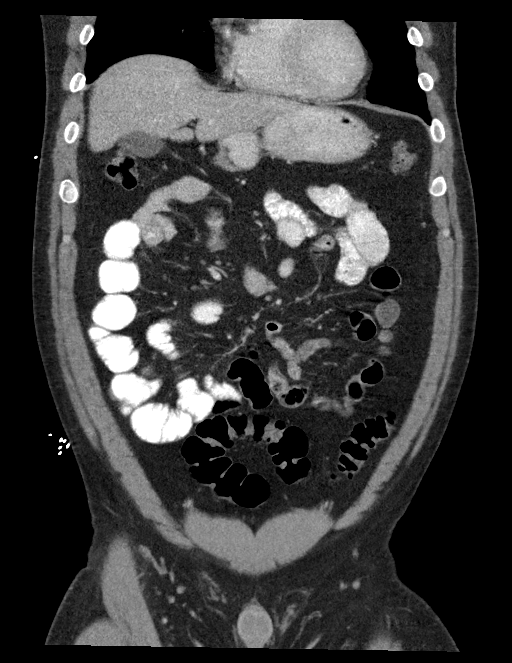
[im 46/104  soft-tissue]
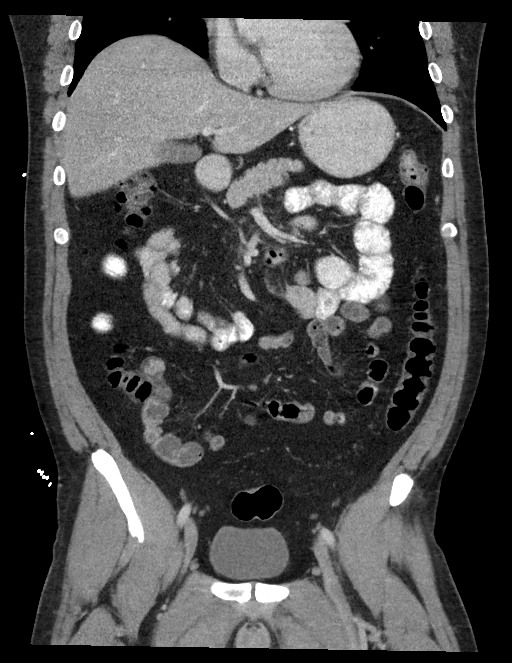
[im 58/104  soft-tissue]
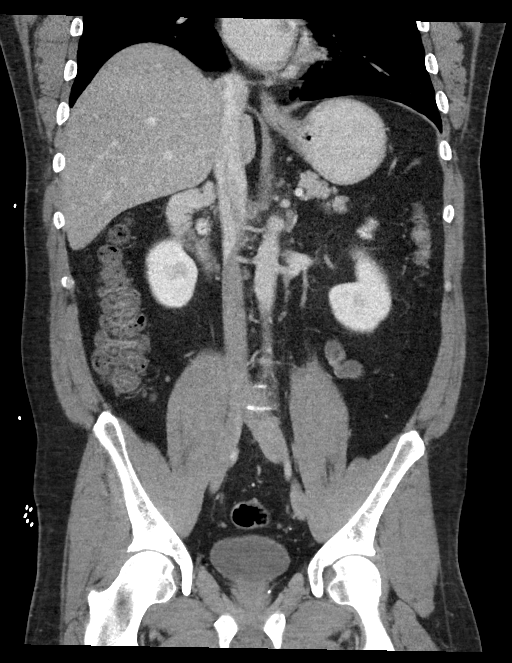

[16 of 46 positions shown; findings below may reference images not displayed]

FINDINGS: Lower chest: The lung bases are clear without focal nodule, mass, or
airspace disease. The heart size is normal. No significant pleural
or pericardial effusion is present.

Hepatobiliary: No focal liver abnormality is seen. No gallstones,
gallbladder wall thickening, or biliary dilatation.

Pancreas: Unremarkable. No pancreatic ductal dilatation or
surrounding inflammatory changes.

Spleen: Normal in size without focal abnormality.

Adrenals/Urinary Tract: The adrenal glands are normal bilaterally.
The kidneys and ureters are within normal limits bilaterally. The
urinary bladder is within normal limits.

Stomach/Bowel: The stomach and duodenum are within normal limits.
Small bowel is unremarkable. The terminal ileum is within normal
limits. The appendix is visualized and normal. The ascending and
transverse colon are within normal limits. The descending and
sigmoid colon are within normal limits.

Vascular/Lymphatic: No significant vascular findings are present. No
enlarged abdominal or pelvic lymph nodes.

Reproductive: Prostate is unremarkable.

Other: No abdominal wall hernia or abnormality. No abdominopelvic
ascites.

Musculoskeletal: No acute or significant osseous findings.
IMPRESSION: Negative CT of the abdomen. No acute or focal abnormality to explain
the patient's epigastric pain.

## 2018-09-07 ENCOUNTER — Emergency Department (HOSPITAL_COMMUNITY)
Admission: EM | Admit: 2018-09-07 | Discharge: 2018-09-08 | Disposition: A | Discharge status: Home / Self Care | Payer: Self-pay | Attending: Emergency Medicine | Admitting: Emergency Medicine

## 2018-09-07 DIAGNOSIS — M545 Low back pain: Secondary | ICD-10-CM | POA: Insufficient documentation

## 2018-09-07 DIAGNOSIS — Z5321 Procedure and treatment not carried out due to patient leaving prior to being seen by health care provider: Secondary | ICD-10-CM | POA: Insufficient documentation

## 2018-09-08 ENCOUNTER — Encounter (HOSPITAL_COMMUNITY): Payer: Self-pay | Admitting: Emergency Medicine

## 2018-09-08 LAB — CBC WITH DIFFERENTIAL/PLATELET
Abs Immature Granulocytes: 0.05 10*3/uL (ref 0.00–0.07)
Basophils Absolute: 0.1 10*3/uL (ref 0.0–0.1)
Basophils Relative: 1 %
EOS ABS: 0.1 10*3/uL (ref 0.0–0.5)
Eosinophils Relative: 1 %
HEMATOCRIT: 52.8 % — AB (ref 39.0–52.0)
Hemoglobin: 17 g/dL (ref 13.0–17.0)
Immature Granulocytes: 0 %
LYMPHS ABS: 2.4 10*3/uL (ref 0.7–4.0)
Lymphocytes Relative: 17 %
MCH: 27.2 pg (ref 26.0–34.0)
MCHC: 32.2 g/dL (ref 30.0–36.0)
MCV: 84.5 fL (ref 80.0–100.0)
MONO ABS: 0.9 10*3/uL (ref 0.1–1.0)
MONOS PCT: 7 %
NRBC: 0 % (ref 0.0–0.2)
Neutro Abs: 10.7 10*3/uL — ABNORMAL HIGH (ref 1.7–7.7)
Neutrophils Relative %: 74 %
Platelets: 310 10*3/uL (ref 150–400)
RBC: 6.25 MIL/uL — ABNORMAL HIGH (ref 4.22–5.81)
RDW: 14.1 % (ref 11.5–15.5)
WBC: 14.3 10*3/uL — ABNORMAL HIGH (ref 4.0–10.5)

## 2018-09-08 LAB — BASIC METABOLIC PANEL
Anion gap: 9 (ref 5–15)
BUN: 12 mg/dL (ref 6–20)
CO2: 19 mmol/L — AB (ref 22–32)
Calcium: 9.5 mg/dL (ref 8.9–10.3)
Chloride: 108 mmol/L (ref 98–111)
Creatinine, Ser: 0.81 mg/dL (ref 0.61–1.24)
GFR calc Af Amer: >60 mL/min (ref >60)
GLUCOSE: 112 mg/dL — AB (ref 70–99)
Potassium: 3.8 mmol/L (ref 3.5–5.1)
Sodium: 136 mmol/L (ref 135–145)

## 2018-09-08 MED ORDER — OXYCODONE-ACETAMINOPHEN 5-325 MG PO TABS
1.0000 | ORAL_TABLET | ORAL | Status: DC | PRN
  Administered 2018-09-08: 1 via ORAL
  Filled 2018-09-08: qty 1

## 2018-09-08 NOTE — ED Notes (Signed)
Pt triaged and given pain medicine by Marta Antu. Pt was shown back out to the lobby to wait for x-ray. Lisa from x-ray came to get patient and we are unable to locate him at this time.

## 2018-09-08 NOTE — ED Notes (Signed)
X ray came to get pt. From lobby. Pt. Not in lobby was seen walking out with family.

## 2018-09-08 NOTE — ED Triage Notes (Signed)
Patient reports worsening low back pain radiating to both legs  onset 2 days ago , denies injury or fall , pain increases with movement / chagimg positions . No urinary discomfort.

## 2019-10-18 IMAGING — US US SOFT TISSUE HEAD/NECK
1 series · 14 of 16 positions shown · non-contrast
Comparison: None.

CLINICAL DATA: Acute left lateral maxillary edema postprandial.

EXAM:
ULTRASOUND OF HEAD/NECK SOFT TISSUES
TECHNIQUE: Ultrasound examination of the head and neck soft tissues was
performed in the area of clinical concern.

[Series 1: us soft tissue head/neck · 0.07mm/px · 16 acquisitions, 14 frames shown]
[im 1/16]
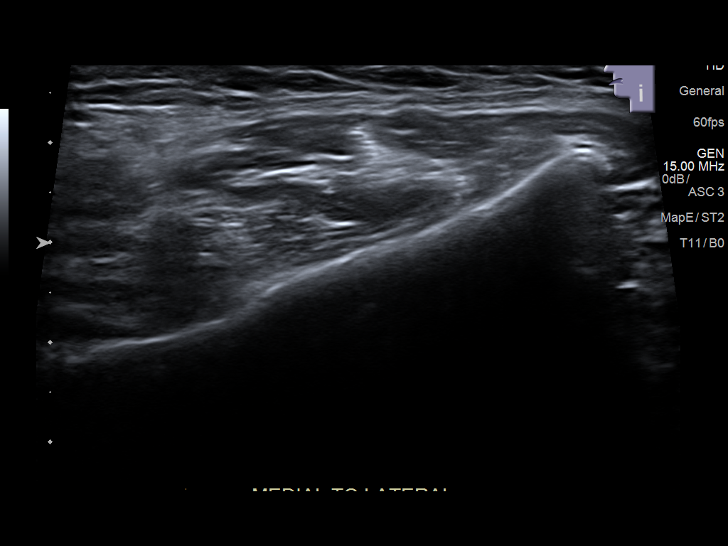
[im 2/16]
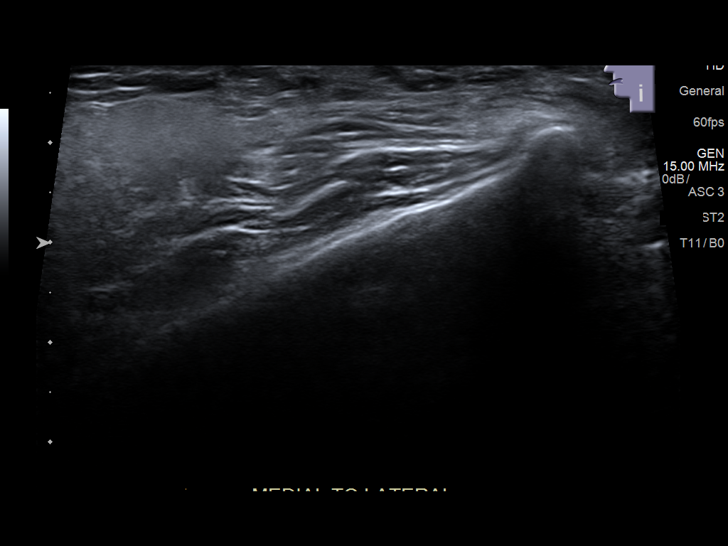
[im 3/16]
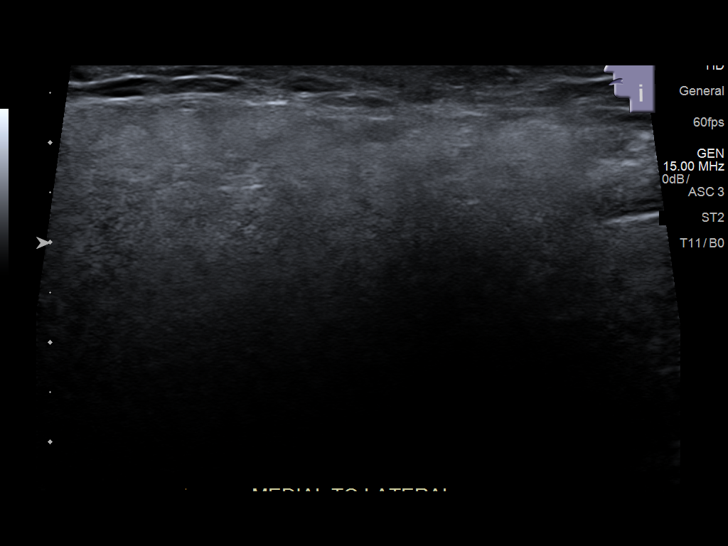
[im 5/16]
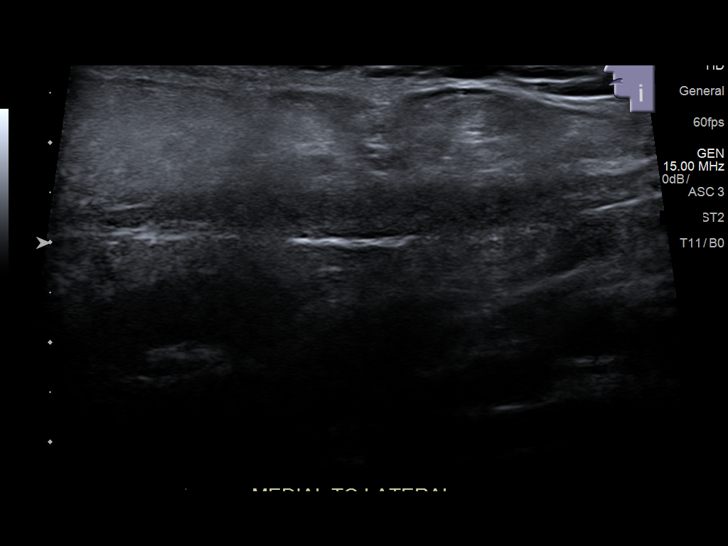
[im 6/16]
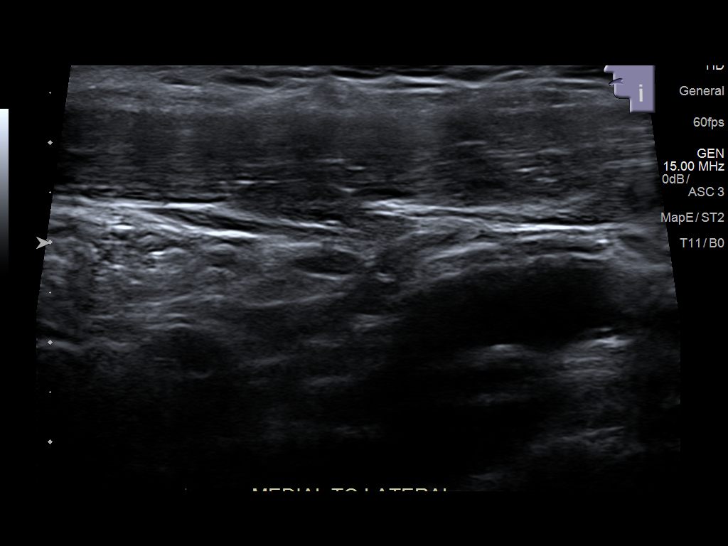
[im 7/16]
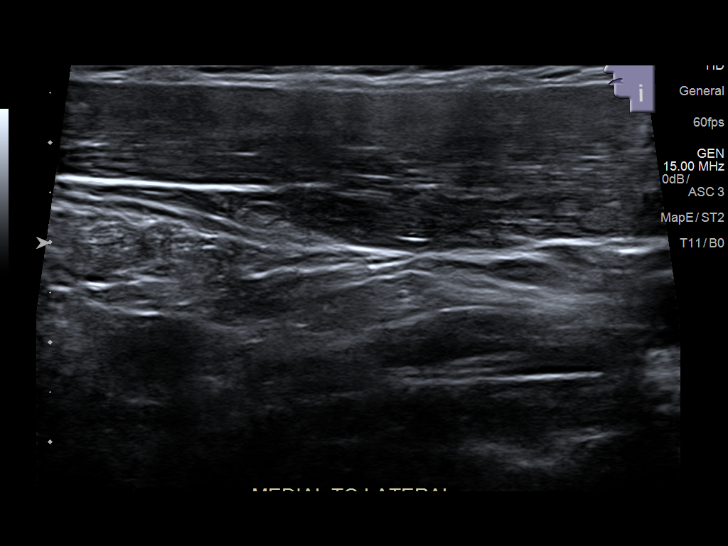
[im 8/16]
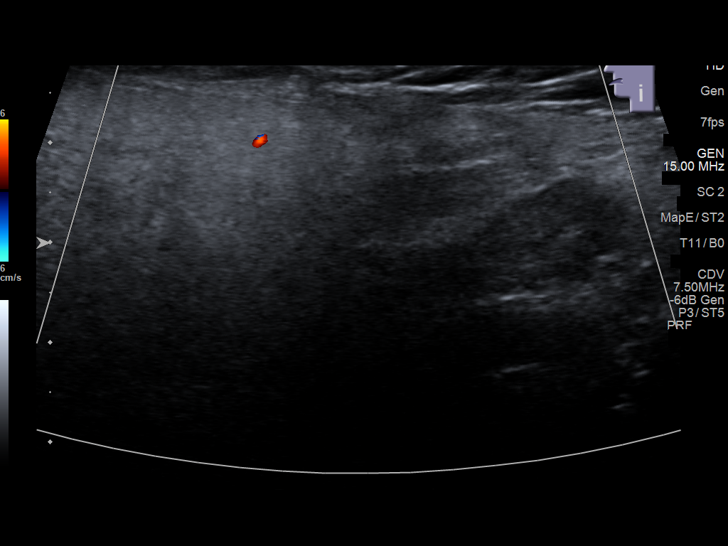
[im 9/16]
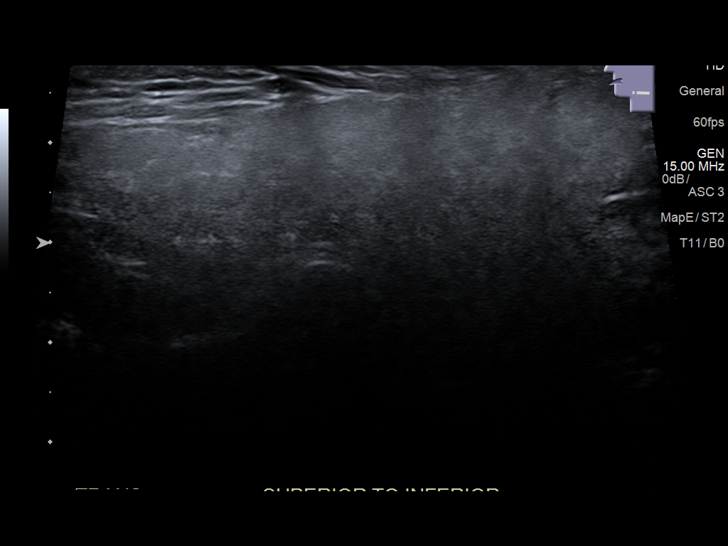
[im 10/16]
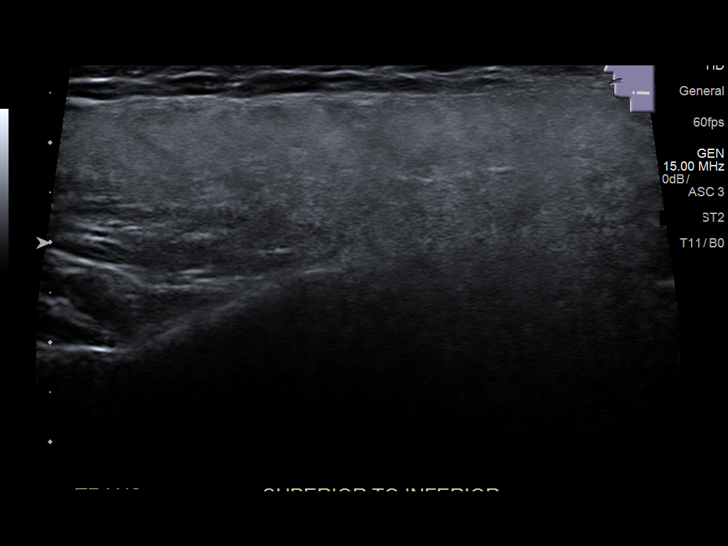
[im 11/16]
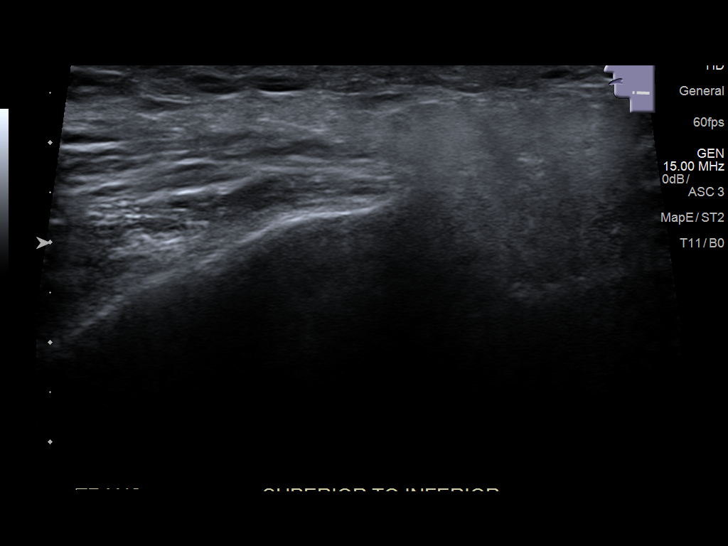
[im 13/16]
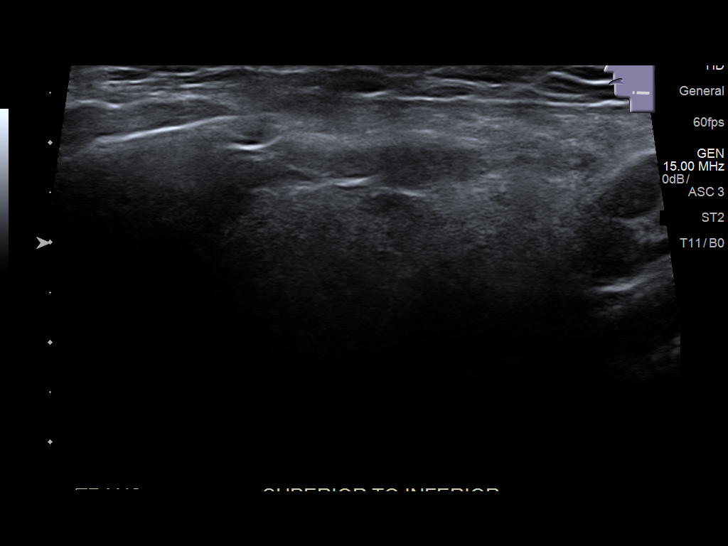
[im 14/16]
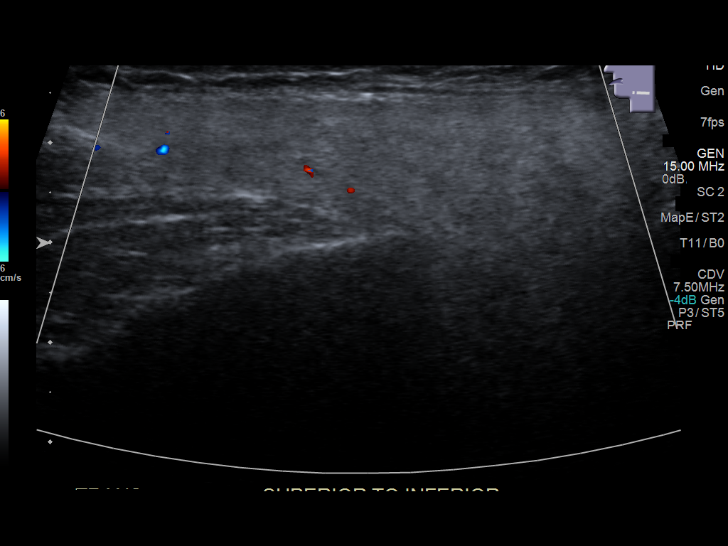
[im 15/16]
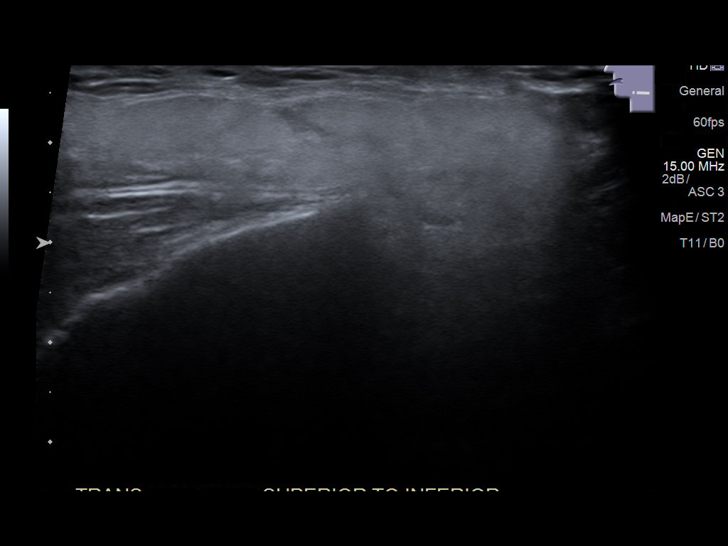
[im 16/16]
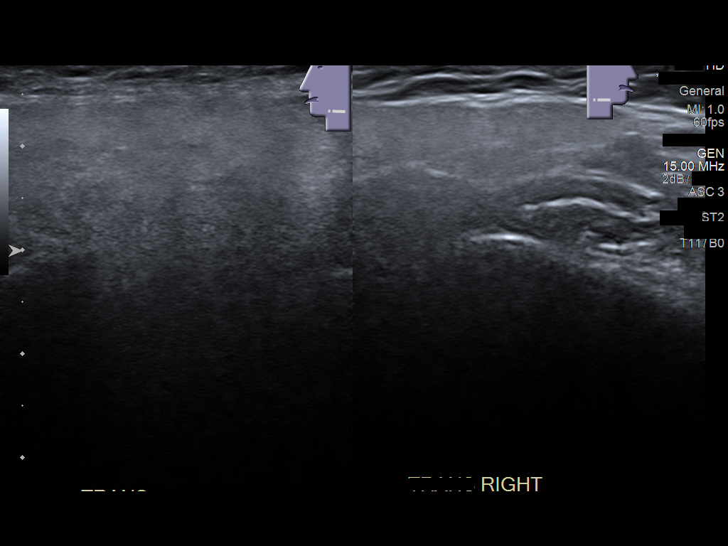

[14 of 16 positions shown; findings below may reference images not displayed]

FINDINGS: No mass, cyst, aneurysm, adenopathy, or abscess on survey ultrasound
interrogation of the region of concern. Contralateral limited right
images are unremarkable.
IMPRESSION: No pathologic findings on ultrasound.
